# Patient Record
Sex: Female | Born: 1975 | Hispanic: Yes | Marital: Single | Smoking: Never smoker
Health system: Southern US, Community
[De-identification: ages and names within clinical notes are randomized; demographics above are authoritative.]

## PROBLEM LIST (undated history)

## (undated) DIAGNOSIS — C801 Malignant (primary) neoplasm, unspecified: Secondary | ICD-10-CM

## (undated) DIAGNOSIS — Z923 Personal history of irradiation: Secondary | ICD-10-CM

## (undated) DIAGNOSIS — Z8049 Family history of malignant neoplasm of other genital organs: Secondary | ICD-10-CM

## (undated) DIAGNOSIS — C50919 Malignant neoplasm of unspecified site of unspecified female breast: Secondary | ICD-10-CM

## (undated) HISTORY — DX: Family history of malignant neoplasm of other genital organs: Z80.49

## (undated) HISTORY — PX: BREAST LUMPECTOMY: SHX2

---

## 2021-12-28 ENCOUNTER — Other Ambulatory Visit (HOSPITAL_COMMUNITY): Payer: Self-pay | Admitting: Nurse Practitioner

## 2021-12-28 DIAGNOSIS — Z1231 Encounter for screening mammogram for malignant neoplasm of breast: Secondary | ICD-10-CM

## 2021-12-30 ENCOUNTER — Other Ambulatory Visit: Payer: Self-pay

## 2021-12-30 DIAGNOSIS — N6459 Other signs and symptoms in breast: Secondary | ICD-10-CM

## 2021-12-30 DIAGNOSIS — N6310 Unspecified lump in the right breast, unspecified quadrant: Secondary | ICD-10-CM

## 2022-01-04 ENCOUNTER — Ambulatory Visit: Payer: Self-pay | Admitting: *Deleted

## 2022-01-04 ENCOUNTER — Other Ambulatory Visit: Payer: Self-pay

## 2022-01-04 ENCOUNTER — Encounter (INDEPENDENT_AMBULATORY_CARE_PROVIDER_SITE_OTHER): Payer: Self-pay

## 2022-01-04 VITALS — BP 104/68 | Wt 126.2 lb

## 2022-01-04 DIAGNOSIS — N644 Mastodynia: Secondary | ICD-10-CM

## 2022-01-04 DIAGNOSIS — N6311 Unspecified lump in the right breast, upper outer quadrant: Secondary | ICD-10-CM

## 2022-01-04 DIAGNOSIS — Z1211 Encounter for screening for malignant neoplasm of colon: Secondary | ICD-10-CM

## 2022-01-04 DIAGNOSIS — Z1239 Encounter for other screening for malignant neoplasm of breast: Secondary | ICD-10-CM

## 2022-01-04 DIAGNOSIS — N6459 Other signs and symptoms in breast: Secondary | ICD-10-CM

## 2022-01-04 NOTE — Progress Notes (Signed)
[name] is a 46 y.o. female who presents to [facility] clinic today with complaint of right outer breast pain x 4 years that comes and goes. Patient rates the pain at a 6 out of 10. Patient complained of right nipple inversion that has happened over the past year. Patient complained of a right breast mass that was palpated on exam 12/28/2021 at the [facility] Department.    Pap Smear: Pap smear not completed today. Last Pap smear was 09/27/2022 at the [facility] Department clinic and patient stated she has not received the results. Per patient has no history of an abnormal Pap smear. Last Pap smear result is not available in Epic.   Physical exam: Breasts Breasts symmetrical. No skin abnormalities bilateral breasts. No nipple retraction left breast. Observed slight right nipple inversion on exam. No nipple discharge bilateral breasts. No lymphadenopathy. No lumps palpated left breast. Palpated a lump within the right breast at 11 o'clock above the areola. Complaints of right outer breast pain on exam.     Pelvic/Bimanual Pap is not indicated today per BCCCP guidelines.   Smoking History: Patient has never smoked.   Patient Navigation: Patient education provided. Access to services provided for patient through [facility] program. Spanish interpreter Debra Hobbs from [facility]  provided.  Colorectal Cancer Screening: Per patient has never had colonoscopy completed. FIT Test given to patient to complete. No complaints today.    Breast and Cervical Cancer Risk Assessment: Patient does not have family history of breast cancer, known genetic mutations, or radiation treatment to the chest before age 89. Patient does not have history of cervical dysplasia, immunocompromised, or DES exposure in-utero.  Risk Assessment     Risk Scores       01/04/2022   Last edited by: Demetrius Revel, LPN   5-year risk: 0.6 %   Lifetime risk: 7.5 %            A: BCCCP  exam without pap smear Complaint of right breast pain, nipple inversion, and mass.  P: Referred patient to the [facility] for a diagnostic mammogram. Appointment scheduled Thursday, January 13, 2022 at 1400.  Loletta Parish, RN 01/04/2022 10:26 AM

## 2022-01-04 NOTE — Patient Instructions (Signed)
Explained breast self awareness with Paul Half. Patient did not need a Pap smear today due to last Pap smear was 12/28/2021 per patient. Let her know BCCCP will cover Pap smears every 3 years unless has a history of abnormal Pap smears. Referred patient to the [facility] for a diagnostic mammogram. Appointment scheduled Thursday, January 13, 2022 at 1400. Patient aware of appointment and will be there. Paul Half verbalized understanding.  Doroteo Nickolson, Arvil Chaco, RN 10:26 AM

## 2022-01-07 ENCOUNTER — Ambulatory Visit (HOSPITAL_COMMUNITY): Payer: Self-pay

## 2022-01-13 ENCOUNTER — Other Ambulatory Visit: Payer: Self-pay | Admitting: Obstetrics and Gynecology

## 2022-01-13 ENCOUNTER — Ambulatory Visit
Admission: RE | Admit: 2022-01-13 | Discharge: 2022-01-13 | Disposition: A | Discharge status: Home / Self Care | Payer: Self-pay | Source: Ambulatory Visit | Attending: Obstetrics and Gynecology | Admitting: Obstetrics and Gynecology

## 2022-01-13 ENCOUNTER — Ambulatory Visit
Admission: RE | Admit: 2022-01-13 | Discharge: 2022-01-13 | Disposition: A | Discharge status: Home / Self Care | Payer: No Typology Code available for payment source | Source: Ambulatory Visit | Attending: Obstetrics and Gynecology | Admitting: Obstetrics and Gynecology

## 2022-01-13 DIAGNOSIS — N6459 Other signs and symptoms in breast: Secondary | ICD-10-CM

## 2022-01-13 DIAGNOSIS — N6310 Unspecified lump in the right breast, unspecified quadrant: Secondary | ICD-10-CM

## 2022-01-20 ENCOUNTER — Ambulatory Visit
Admission: RE | Admit: 2022-01-20 | Discharge: 2022-01-20 | Disposition: A | Discharge status: Home / Self Care | Payer: No Typology Code available for payment source | Source: Ambulatory Visit | Attending: Obstetrics and Gynecology | Admitting: Obstetrics and Gynecology

## 2022-01-20 DIAGNOSIS — N6459 Other signs and symptoms in breast: Secondary | ICD-10-CM

## 2022-01-20 DIAGNOSIS — N6310 Unspecified lump in the right breast, unspecified quadrant: Secondary | ICD-10-CM

## 2022-01-20 HISTORY — PX: BREAST BIOPSY: SHX20

## 2022-01-26 ENCOUNTER — Encounter: Payer: Self-pay | Admitting: Adult Health

## 2022-01-26 DIAGNOSIS — C50211 Malignant neoplasm of upper-inner quadrant of right female breast: Secondary | ICD-10-CM | POA: Insufficient documentation

## 2022-01-26 DIAGNOSIS — Z170 Estrogen receptor positive status [ER+]: Secondary | ICD-10-CM | POA: Insufficient documentation

## 2022-01-28 ENCOUNTER — Other Ambulatory Visit: Payer: Self-pay | Admitting: Surgery

## 2022-02-01 ENCOUNTER — Other Ambulatory Visit: Payer: Self-pay | Admitting: *Deleted

## 2022-02-01 ENCOUNTER — Telehealth: Payer: Self-pay | Admitting: Radiation Oncology

## 2022-02-01 DIAGNOSIS — Z170 Estrogen receptor positive status [ER+]: Secondary | ICD-10-CM

## 2022-02-01 DIAGNOSIS — C50211 Malignant neoplasm of upper-inner quadrant of right female breast: Secondary | ICD-10-CM

## 2022-02-01 NOTE — Telephone Encounter (Signed)
Called patient to schedule a consultation w. Dr. Squire. No answer, LVM for a return call.  

## 2022-02-02 ENCOUNTER — Other Ambulatory Visit: Payer: Self-pay | Admitting: *Deleted

## 2022-02-03 ENCOUNTER — Telehealth: Payer: Self-pay | Admitting: Hematology and Oncology

## 2022-02-03 NOTE — Telephone Encounter (Signed)
Scheduled appt per 2/15 referral. Used interpreter services. Pt is aware of appt date and time. Pt is aware to arrive 15 mins prior to appt time and to bring and updated insurance card. Pt is aware of appt location.

## 2022-02-06 NOTE — Progress Notes (Signed)
Radiation Oncology         [phone] ________________________________  Initial Outpatient Consultation  Name: Debra Hobbs MRN: [id number]  Date: 02/08/2022  DOB: 05-07-1976  CC:Pcp, No  Coralie Keens, MD   REFERRING PHYSICIAN: Coralie Keens, MD  DIAGNOSIS:    ICD-10-CM   1. 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 female who presented with right breast pain for over a year as well as retraction of her nipple. Subsequent diagnostic bilateral mammogram and right breast ultrasound on 01/13/22 revealed a highly suspicious mass in the retroareolar right breast between the 12 and 1 o'clock positions measuring at least 3.3 cm. No evidence of right axillary lymphadenopathy was appreciated.    Retroareolar right breast biopsy at the 1 o'clock position on date of 01/20/22 showed grade 2 invasive mammary/lobular carcinoma measuring 0.6 cm in the greatest linear extent, and mammary carcinoma in-situ.  ER status: 90% positive with moderate staining intensity; PR status 90% positive with strong staining intensity' Proliferation marker Ki67 at 1%; Her2 status negative; Grade 2. No lymph nodes were examined.  Subsequently, the patient was referred to Dr. Ninfa Linden on 01/28/22 to discuss surgical treatment options. Physical exam performed during this visit revealed deep fullness in the breast centrally at the 12 to 1 o'clock position on the right side. Dr. Ninfa Linden recommends an MRI to determine the overall extent and whether there is nipple involvement. This will determine whether or not the patient would need a central lumpectomy with removal of the  nipple areolar complex versus a mastectomy. The patient will return to Dr. Ninfa Linden following imaging to review results and discuss surgical options further.   MRI breasts pending.  She is with an interpreter. She denies sexual activity currently. She has a family history of GYN cancer in a cousin (uterine vs ovarian? Unknown). Denies smoking or ETOH. No past breastfeeding. She has two children, ages 22 and 31.  Past/Anticipated interventions by medical oncology, if any:  Scheduled for consultation with Dr. Nicholas Lose on 02/14/2022   Lymphedema issues, if any:  None     Pain issues, if any:  Reports occasional pain/discomfort to the right breast. States it resolves on its own    SAFETY ISSUES: Prior radiation? No Pacemaker/ICD? No Possible current pregnancy?No: LMP ~02/01/2022 Is the patient on methotrexate? No   PREVIOUS RADIATION THERAPY: No  PAST MEDICAL HISTORY:  has no past medical history on file.  She has a family history of GYN cancer in a cousin (uterine vs ovarian? Unknown).  PAST SURGICAL HISTORY: Past Surgical History:  Procedure Laterality Date   BREAST BIOPSY Right 01/20/2022    FAMILY HISTORY: family history includes Uterine cancer in her cousin.  SOCIAL HISTORY:  reports that she has never smoked. She has never used smokeless tobacco. She reports that she does not currently use alcohol. She reports that she does not use drugs.  ALLERGIES: Patient has no known allergies.  MEDICATIONS:  No current outpatient medications on file.   No current facility-administered medications for this encounter.    REVIEW OF SYSTEMS: As above in HPI.   PHYSICAL EXAM:  height is 5' (1.524 m)  and weight is 125 lb 2 oz (56.8 kg). Her temperature is 97.2 F (36.2 C) (abnormal). Her blood pressure is 123/74 and her pulse is 65. Her respiration is 18.   General: Alert and oriented, in no acute distress HEENT: Head is normocephalic. Extraocular movements are intact.   Neck: Neck is  supple, no palpable cervical or supraclavicular lymphadenopathy. Heart: Regular in rate and rhythm with no murmurs, rubs, or gallops. Chest: Clear to auscultation bilaterally, with no rhonchi, wheezes, or rales. Extremities: No cyanosis or edema. Skin: No concerning lesions. Musculoskeletal: symmetric strength and muscle tone throughout. Neurologic: Cranial nerves II through XII are grossly intact. No obvious focalities. Speech is fluent. Coordination is intact. Psychiatric: Judgment and insight are intact. Affect is appropriate. Breasts: dense breast tissue, difficult to discern discrete mass in right breast. No obvious palpable masses appreciated in the breasts or axillae b/l .   ECOG = 0  0 - Asymptomatic (Fully active, able to carry on all predisease activities without restriction)  1 - Symptomatic but completely ambulatory (Restricted in physically strenuous activity but ambulatory and able to carry out work of a light or sedentary nature. For example, light housework, office work)  2 - Symptomatic, <50% in bed during the day (Ambulatory and capable of all self care but unable to carry out any work activities. Up and about more than 50% of waking hours)  3 - Symptomatic, >50% in bed, but not bedbound (Capable of only limited self-care, confined to bed or chair 50% or more of waking hours)  4 - Bedbound (Completely disabled. Cannot carry on any self-care. Totally confined to bed or chair)  5 - Death   Eustace Pen MM, Creech RH, Tormey DC, et al. [phone]). "Toxicity and response criteria of the [facility]". [place] Oncol. 5 (6): 649-55   LABORATORY DATA:  No results found for: WBC, HGB, HCT, MCV, PLT CMP  No results found for: NA, K, CL, CO2, GLUCOSE, BUN, CREATININE, CALCIUM, PROT, ALBUMIN, AST, ALT, ALKPHOS, BILITOT, GFRNONAA, GFRAA       RADIOGRAPHY: US BREAST LTD UNI RIGHT INC AXILLA  Result Date: 01/13/2022 CLINICAL DATA:  46 year old female presenting  for evaluation of progressive right nipple inversion over the past year. This is the patient's baseline exam. EXAM: DIGITAL DIAGNOSTIC BILATERAL MAMMOGRAM WITH TOMOSYNTHESIS AND CAD; ULTRASOUND RIGHT BREAST LIMITED TECHNIQUE: Bilateral digital diagnostic mammography and breast tomosynthesis was performed. The images were evaluated with computer-aided detection.; Targeted ultrasound examination of the right breast was performed COMPARISON:  None. ACR Breast Density Category d: The breast tissue is extremely dense, which lowers the sensitivity of mammography. FINDINGS: Pronounced distortion is noted in the anterior retroareolar right breast. No other suspicious calcifications, masses or areas of distortion are seen in the bilateral breasts. Physical exam of the right breast demonstrates asymmetric partial retraction of the right nipple. Ultrasound targeted to the retroareolar right breast demonstrates an ill-defined hypoechoic area with sonographic distortion between 12 and 1 o'clock. The most masslike portion of the abnormality is at 12 o'clock, 2 cm from the nipple, but all together, the abnormality spans approximately 3.3 x 1.8 x 2.9 cm as measured at 1 o'clock, 1 cm from the nipple. Measurement is difficult as the margins are very indistinct. Ultrasound of the right axilla demonstrates multiple normal-appearing lymph nodes. IMPRESSION: 1. There is a highly suspicious mass in the retroareolar right breast between 12 and 1 o'clock measuring at least 3.3 cm, though measurement is difficult due to the indistinct appearance.  2.  No evidence of right axillary lymphadenopathy. RECOMMENDATION: 1. Ultrasound-guided biopsy is recommended for the right breast mass, and has been scheduled for 01/20/2022 at 7:30 a.m. 2. If malignancy is found on pathology, and the patient desires breast conservation, MRI is recommended to determine true extent of disease given the ill-defined appearance of the mass, the patient's breast tissue  density and her premenopausal status. I have discussed the findings and recommendations with the patient. If applicable, a reminder letter will be sent to the patient regarding the next appointment. BI-RADS CATEGORY  5: Highly suggestive of malignancy. Electronically Signed   By: Ammie Ferrier M.D.   On: 01/13/2022 15:15  MS DIGITAL DIAG TOMO BILAT  Result Date: 01/13/2022 CLINICAL DATA:  46 year old female presenting for evaluation of progressive right nipple inversion over the past year. This is the patient's baseline exam. EXAM: DIGITAL DIAGNOSTIC BILATERAL MAMMOGRAM WITH TOMOSYNTHESIS AND CAD; ULTRASOUND RIGHT BREAST LIMITED TECHNIQUE: Bilateral digital diagnostic mammography and breast tomosynthesis was performed. The images were evaluated with computer-aided detection.; Targeted ultrasound examination of the right breast was performed COMPARISON:  None. ACR Breast Density Category d: The breast tissue is extremely dense, which lowers the sensitivity of mammography. FINDINGS: Pronounced distortion is noted in the anterior retroareolar right breast. No other suspicious calcifications, masses or areas of distortion are seen in the bilateral breasts. Physical exam of the right breast demonstrates asymmetric partial retraction of the right nipple. Ultrasound targeted to the retroareolar right breast demonstrates an ill-defined hypoechoic area with sonographic distortion between 12 and 1 o'clock. The most masslike portion of the abnormality is at 12 o'clock, 2 cm from the nipple, but all together, the abnormality spans approximately 3.3 x 1.8 x 2.9 cm as measured at 1 o'clock, 1 cm from the nipple. Measurement is difficult as the margins are very indistinct. Ultrasound of the right axilla demonstrates multiple normal-appearing lymph nodes. IMPRESSION: 1. There is a highly suspicious mass in the retroareolar right breast between 12 and 1 o'clock measuring at least 3.3 cm, though measurement is difficult due  to the indistinct appearance. 2.  No evidence of right axillary lymphadenopathy. RECOMMENDATION: 1. Ultrasound-guided biopsy is recommended for the right breast mass, and has been scheduled for 01/20/2022 at 7:30 a.m. 2. If malignancy is found on pathology, and the patient desires breast conservation, MRI is recommended to determine true extent of disease given the ill-defined appearance of the mass, the patient's breast tissue density and her premenopausal status. I have discussed the findings and recommendations with the patient. If applicable, a reminder letter will be sent to the patient regarding the next appointment. BI-RADS CATEGORY  5: Highly suggestive of malignancy. Electronically Signed   By: Ammie Ferrier M.D.   On: 01/13/2022 15:15  MM CLIP PLACEMENT RIGHT  Result Date: 01/20/2022 CLINICAL DATA:  Status post ultrasound-guided core biopsy of RIGHT breast mass. EXAM: 3D DIAGNOSTIC RIGHT MAMMOGRAM POST ULTRASOUND BIOPSY COMPARISON:  Previous exam(s). FINDINGS: 3D Mammographic images were obtained following ultrasound guided biopsy of mass in the 1 o'clock retroareolar region of the RIGHT breast and placement of a ribbon shaped. The biopsy marking clip is in expected position at the site of biopsy. IMPRESSION: Appropriate positioning of the ribbon shaped biopsy marking clip at the site of biopsy in the retroareolar RIGHT breast. Final Assessment: Post Procedure Mammograms for Marker Placement Electronically Signed   By: Nolon Nations M.D.   On: 01/20/2022 08:30  [place] RT BREAST BX W LOC DEV 1ST LESION IMG BX SPEC US  GUIDE  Addendum Date: 01/28/2022   ADDENDUM REPORT: 01/28/2022 08:43 ADDENDUM: Pathology revealed GRADE II INVASIVE MAMMARY CARCINOMA, MAMMARY CARCINOMA IN-SITU of the RIGHT breast, 1 o'clock, retroareolar region, (ribbon clip). This was found to be concordant by Dr. Nolon Nations. Pathology results were discussed with the patient by telephone by Kathrine Haddock, Bilingual Patient  Services Representative. The patient reported doing well after the biopsy with tenderness at the site. Post biopsy instructions and care were reviewed and questions were answered. The patient was encouraged to call [facility] for any additional concerns. Surgical consultation has been arranged with Dr. Nedra Hai at [facility] on January 28, 2022. MRI is recommended if breast conservation is being considered to determine true extent of disease given the ill-defined appearance of the mass, the patient's breast tissue density and her premenopausal status. Pathology results reported by Terie Purser, RN on 01/21/2022. Electronically Signed   By: Nolon Nations M.D.   On: 01/28/2022 08:43   Result Date: 01/28/2022 CLINICAL DATA:  Patient presents for ultrasound-guided core biopsy of RIGHT breast mass. EXAM: ULTRASOUND GUIDED RIGHT BREAST CORE NEEDLE BIOPSY COMPARISON:  Previous exam(s). PROCEDURE: I met with the patient and we discussed the procedure of ultrasound-guided biopsy, including benefits and alternatives. We discussed the high likelihood of a successful procedure. We discussed the risks of the procedure, including infection, bleeding, tissue injury, clip migration, and inadequate sampling. Informed written consent was given. The usual time-out protocol was performed immediately prior to the procedure. Lesion quadrant: Central breast, 1 o'clock retroareolar Using sterile technique and 1% Lidocaine as local anesthetic, under direct ultrasound visualization, a 12 gauge spring-loaded device was used to perform biopsy of mass in the 1 o'clock retroareolar region of the RIGHT breast using a inferomedial approach. At the conclusion of the procedure ribbon shaped tissue marker clip was deployed into the biopsy cavity. Follow up 2 view mammogram was performed and dictated separately. IMPRESSION: Ultrasound guided biopsy of RIGHT breast mass. No apparent complications.  Electronically Signed: By: Nolon Nations M.D. On: 01/20/2022 08:13     IMPRESSION/PLAN: Right breast cancer ER+  MRI pending - nursing will look into why this has not been scheduled, yet.  Surgery (lumpectomy vs mastectomy) will depend on these results. We talked about lumpectomy --> radiation therapy vs mastectomy (with smaller chance of needing radiation therapy) today.   It was a pleasure meeting the patient today. We discussed the risks, benefits, and side effects of radiotherapy. If she conserves her right  breast, I recommend radiotherapy to the breast to reduce her risk of locoregional recurrence by 2/3.  We discussed that radiation would take approximately 4-6 weeks to complete and that I would give the patient a few weeks to heal following surgery before starting treatment planning.  If chemotherapy were to be given, this would precede radiotherapy. We spoke about acute effects including skin irritation and fatigue as well as much less common late effects including internal organ injury or irritation. We spoke about the latest technology that is used to minimize the risk of late effects for patients undergoing radiotherapy to the breast or chest wall. No guarantees of treatment were given. The patient is enthusiastic about proceeding with treatment. I look forward to participating in the patient's care. Consent signed today. I will await her referral back to me for postoperative follow-up and eventual CT simulation/treatment planning. She understands there is a smaller chance of needing radiation therapy post-mastectomy.  I personally messaged genetics to inquire  as to whether a referral is appropriate in her case.  We also discussed importance of avoiding pregnancy if sexually active- use of condoms recommended.  On date of service, in total, I spent 60 minutes on this encounter. Patient was seen in person.   __________________________________________   Eppie Gibson, MD   This  document serves as a record of services personally performed by Eppie Gibson, MD. It was created on her behalf by Roney Mans, a trained medical scribe. The creation of this record is based on the scribe's personal observations and the provider's statements to them. This document has been checked and approved by the attending provider.

## 2022-02-07 NOTE — Progress Notes (Signed)
Location of Breast Cancer:  Malignant neoplasm of central portion of right breast in female, estrogen receptor positive   Histology per Pathology Report:  (Definitive pathology pending eventual surgery) 01/20/2022 Diagnosis Breast, right, needle core biopsy, 1 o'clock, retroareolar region, ribbon clip - INVASIVE MAMMARY CARCINOMA - MAMMARY CARCINOMA IN-SITU - SEE COMMENT Microscopic Comment the biopsy material shows an infiltrative proliferation of arranged linearly and in small clusters. Based on the biopsy, the carcinoma appears Nottingham grade 2 of 3 and measures 0.6 cm in greatest linear extent.  Receptor Status: ER(90%), PR (90%), Her2-neu (Negative via [place]), Ki-67(1%)  Did patient present with symptoms (if so, please note symptoms) or was this found on screening mammography?:    Past/Anticipated interventions by surgeon, if any:  01/28/2022 Dr. Coralie Keens (office visit)   Past/Anticipated interventions by medical oncology, if any:  Scheduled for consultation with Dr. Nicholas Lose on 02/14/2022  Lymphedema issues, if any:  None    Pain issues, if any:  Reports occasional pain/discomfort to the right breast. States it resolves on its own   SAFETY ISSUES: Prior radiation? No Pacemaker/ICD? No Possible current pregnancy?No: LMP ~02/01/2022 Is the patient on methotrexate? No  Current Complaints / other details:  Nothing else of note

## 2022-02-08 ENCOUNTER — Encounter: Payer: Self-pay | Admitting: Radiation Oncology

## 2022-02-08 ENCOUNTER — Other Ambulatory Visit: Payer: Self-pay

## 2022-02-08 ENCOUNTER — Ambulatory Visit
Admission: RE | Admit: 2022-02-08 | Discharge: 2022-02-08 | Disposition: A | Discharge status: Home / Self Care | Payer: No Typology Code available for payment source | Source: Ambulatory Visit | Attending: Radiation Oncology | Admitting: Radiation Oncology

## 2022-02-08 VITALS — BP 123/74 | HR 65 | Temp 97.2°F | Resp 18 | Ht 60.0 in | Wt 125.1 lb

## 2022-02-08 DIAGNOSIS — C50211 Malignant neoplasm of upper-inner quadrant of right female breast: Secondary | ICD-10-CM

## 2022-02-08 DIAGNOSIS — Z808 Family history of malignant neoplasm of other organs or systems: Secondary | ICD-10-CM | POA: Insufficient documentation

## 2022-02-08 DIAGNOSIS — Z170 Estrogen receptor positive status [ER+]: Secondary | ICD-10-CM

## 2022-02-09 ENCOUNTER — Other Ambulatory Visit: Payer: Self-pay | Admitting: Surgery

## 2022-02-09 DIAGNOSIS — C50511 Malignant neoplasm of lower-outer quadrant of right female breast: Secondary | ICD-10-CM

## 2022-02-09 DIAGNOSIS — Z170 Estrogen receptor positive status [ER+]: Secondary | ICD-10-CM

## 2022-02-10 ENCOUNTER — Telehealth: Payer: Self-pay | Admitting: Genetic Counselor

## 2022-02-10 NOTE — Telephone Encounter (Signed)
Scheduled appt per 2/21 referral. Used interpreter services. Pt is aware of appt date and time. Pt is aware to arrive 15 mins prior to appt time and to bring and updated insurance card. Pt is aware of appt location.

## 2022-02-11 NOTE — Progress Notes (Signed)
[facility] CONSULT NOTE  Patient Care Team: Pcp, No as PCP - General  CHIEF COMPLAINTS/PURPOSE OF CONSULTATION:  Newly diagnosed right breast cancer  HISTORY OF PRESENTING ILLNESS:  Debra Hobbs 46 y.o. female is here because of recent diagnosis of invasive mammary carcinoma of the right breast. She presented with progressive right nipple inversion over the past year. Diagnostic mammogram and [place] on 01/13/2022 showed a highly suspicious mass in the retroareolar right breast between 12 and 1 o'clock measuring at least 3.3 cm. Biopsy on 01/20/2022 showed invasive mammary carcinoma and mammary carcinoma in situ, Her2-, ER/PR+(90%). She presents to the clinic today for initial evaluation and discussion of treatment options.   I reviewed her records extensively and collaborated the history with the patient.  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  SURGICAL HISTORY: Past Surgical History:  Procedure Laterality Date   BREAST BIOPSY Right 01/20/2022    SOCIAL HISTORY: Social History   Socioeconomic History   Marital status: Single    Spouse name: Not on file   Number of children: 2   Years of education: Not on file   Highest education level: 9th grade  Occupational History   Not on file  Tobacco Use   Smoking status: Never   Smokeless tobacco: Never   Vaping Use   Vaping Use: Never used  Substance and Sexual Activity   Alcohol use: Not Currently   Drug use: Never   Sexual activity: Not Currently  Other Topics Concern   Not on file  Social History Narrative   Not on file   Social Determinants of Health   Financial Resource Strain: Not on file  Food Insecurity: No Food Insecurity   Worried About Running Out of Food in the Last Year: Never true   [place] in the Last Year: Never true  Transportation Needs: No Transportation Needs   Lack of Transportation (Medical): No   Lack of Transportation (Non-Medical): No  Physical Activity: Not on file  Stress: Not on file  Social Connections: Not on file  Intimate Partner Violence: Not on file    FAMILY HISTORY: Family History  Problem Relation Age of Onset   Uterine cancer Cousin    Breast cancer Neg Hx     ALLERGIES:  has No Known Allergies.  MEDICATIONS:  No current outpatient medications on file.   No current facility-administered medications for this visit.    REVIEW OF SYSTEMS:   All other systems were reviewed with the patient and are negative.  PHYSICAL EXAMINATION: ECOG PERFORMANCE STATUS: 1 - Symptomatic but completely ambulatory  Vitals:   02/14/22 1256  BP: 109/62  Pulse: 74  Resp: 18  Temp: 98.1 F (36.7 C)  SpO2: 100%   Filed Weights   02/14/22 1256  Weight: 126 lb 6.4 oz (57.3 kg)     RADIOGRAPHIC STUDIES: I have personally reviewed the  radiological reports and agreed with the findings in the report.  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 After reviewing the pathology in detail, we proceeded to discuss the different treatment options between surgery, radiation, chemotherapy, antiestrogen therapies.  Recommendations: 1. Breast conserving surgery followed by 2. Oncotype DX testing to determine if chemotherapy would be of any benefit followed by 3. Adjuvant radiation therapy followed by 4. Adjuvant antiestrogen therapy  Oncotype counseling: I discussed Oncotype DX test. I explained to the patient that this is a 21 gene panel to evaluate patient tumors DNA to calculate recurrence score. This would help determine whether patient has high risk or low risk breast cancer. She understands that if her tumor was found to be high risk, she would benefit from systemic chemotherapy. If low risk, no need of chemotherapy.  Return to clinic after surgery to discuss final pathology report and then determine if Oncotype DX testing will need to be sent. 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  She is worried about losing income and the cost of care as well.     All questions were answered. The patient knows to call the clinic with any problems, questions or concerns.   Rulon Eisenmenger, MD, MPH 02/14/2022    I, Thana Ates, am acting as scribe for Nicholas Lose, MD.  I have reviewed the above documentation for accuracy and completeness, and I agree with the above.

## 2022-02-12 ENCOUNTER — Ambulatory Visit
Admission: RE | Admit: 2022-02-12 | Discharge: 2022-02-12 | Disposition: A | Discharge status: Home / Self Care | Payer: No Typology Code available for payment source | Source: Ambulatory Visit | Attending: Surgery | Admitting: Surgery

## 2022-02-12 DIAGNOSIS — Z170 Estrogen receptor positive status [ER+]: Secondary | ICD-10-CM

## 2022-02-12 DIAGNOSIS — C50511 Malignant neoplasm of lower-outer quadrant of right female breast: Secondary | ICD-10-CM

## 2022-02-12 MED ORDER — GADOBUTROL 1 MMOL/ML IV SOLN
6.0000 mL | Freq: Once | INTRAVENOUS | Status: AC | PRN
  Administered 2022-02-12: 6 mL via INTRAVENOUS

## 2022-02-14 ENCOUNTER — Inpatient Hospital Stay
Payer: No Typology Code available for payment source | Attending: Hematology and Oncology | Admitting: Hematology and Oncology

## 2022-02-14 ENCOUNTER — Encounter: Payer: Self-pay | Admitting: *Deleted

## 2022-02-14 ENCOUNTER — Telehealth: Payer: Self-pay

## 2022-02-14 ENCOUNTER — Other Ambulatory Visit: Payer: Self-pay

## 2022-02-14 DIAGNOSIS — Z170 Estrogen receptor positive status [ER+]: Secondary | ICD-10-CM | POA: Insufficient documentation

## 2022-02-14 DIAGNOSIS — C50211 Malignant neoplasm of upper-inner quadrant of right female breast: Secondary | ICD-10-CM | POA: Insufficient documentation

## 2022-02-14 NOTE — Telephone Encounter (Signed)
Via Debra Hobbs, Spanish Intepreter [facility]), Verification of [place] citizenship was asked to determine if patient was eligible to apply for [facility] Medicaid (Dx: Breast cancer). Patient verified that she was not a [place] citizen nor a resident, was given [place] application.

## 2022-02-14 NOTE — Assessment & Plan Note (Signed)
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 After reviewing the pathology in detail, we proceeded to discuss the different treatment options between surgery, radiation, chemotherapy, antiestrogen therapies.  Recommendations: 1. Breast conserving surgery followed by 2. Oncotype DX testing to determine if chemotherapy would be of any benefit followed by 3. Adjuvant radiation therapy followed by 4. Adjuvant antiestrogen therapy  Oncotype counseling: I discussed Oncotype DX test. I explained to the patient that this is a 21 gene panel to evaluate patient tumors DNA to calculate recurrence score. This would help determine whether patient has high risk or low risk breast cancer. She understands that if her tumor was found to be high risk, she would benefit from systemic chemotherapy. If low risk, no need of chemotherapy.  Return to clinic after surgery to discuss final pathology report and then determine if Oncotype DX testing will need to be sent.

## 2022-02-22 ENCOUNTER — Ambulatory Visit: Payer: No Typology Code available for payment source | Admitting: Physical Therapy

## 2022-02-22 ENCOUNTER — Other Ambulatory Visit: Payer: Self-pay | Admitting: Genetic Counselor

## 2022-02-22 DIAGNOSIS — C50211 Malignant neoplasm of upper-inner quadrant of right female breast: Secondary | ICD-10-CM

## 2022-02-22 DIAGNOSIS — Z170 Estrogen receptor positive status [ER+]: Secondary | ICD-10-CM

## 2022-02-23 ENCOUNTER — Encounter: Payer: Self-pay | Admitting: Physical Therapy

## 2022-02-23 ENCOUNTER — Encounter: Payer: Self-pay | Admitting: Genetic Counselor

## 2022-02-23 ENCOUNTER — Inpatient Hospital Stay: Payer: Self-pay | Attending: Hematology and Oncology | Admitting: Genetic Counselor

## 2022-02-23 ENCOUNTER — Inpatient Hospital Stay: Payer: Self-pay

## 2022-02-23 ENCOUNTER — Ambulatory Visit: Payer: No Typology Code available for payment source | Attending: Surgery | Admitting: Physical Therapy

## 2022-02-23 ENCOUNTER — Other Ambulatory Visit: Payer: Self-pay

## 2022-02-23 DIAGNOSIS — Z8049 Family history of malignant neoplasm of other genital organs: Secondary | ICD-10-CM | POA: Insufficient documentation

## 2022-02-23 DIAGNOSIS — Z170 Estrogen receptor positive status [ER+]: Secondary | ICD-10-CM

## 2022-02-23 DIAGNOSIS — C50211 Malignant neoplasm of upper-inner quadrant of right female breast: Secondary | ICD-10-CM

## 2022-02-23 DIAGNOSIS — R293 Abnormal posture: Secondary | ICD-10-CM | POA: Insufficient documentation

## 2022-02-23 LAB — GENETIC SCREENING ORDER

## 2022-02-23 NOTE — Therapy (Signed)
OUTPATIENT PHYSICAL THERAPY BREAST CANCER BASELINE EVALUATION   Patient Name: Debra Hobbs MRN: [id number] DOB:[birth year], 46 y.o., female Today's Date: 02/23/2022   PT End of Session - 02/23/22 0811     Visit Number 1    Number of Visits 2    Date for PT Re-Evaluation 04/20/22    PT Start Time 0808   interpreter arrived late   PT Stop Time 0858    PT Time Calculation (min) 50 min    Activity Tolerance Patient tolerated treatment well    Behavior During Therapy [facility] for tasks assessed/performed             History reviewed. No pertinent past medical history. Past Surgical History:  Procedure Laterality Date   BREAST BIOPSY Right 01/20/2022   Patient Active Problem List   Diagnosis Date Noted   Malignant neoplasm of upper-inner quadrant of right breast in female, estrogen receptor positive ([place]) 01/26/2022    PCP: Pcp, No  REFERRING PROVIDER: Coralie Keens, MD  REFERRING DIAG: C50.211,Z17.0 (ICD-10-CM) - Malignant neoplasm of upper-inner quadrant of right breast in female, estrogen receptor positive ([place])   THERAPY DIAG:  Abnormal posture  Malignant neoplasm of upper-inner quadrant of right breast in female, estrogen receptor positive ([place])  ONSET DATE: 01/20/22  SUBJECTIVE                                                                                                                                                                                           SUBJECTIVE STATEMENT: Patient reports she is here today to be seen by her medical team for her newly diagnosed right breast cancer.   PERTINENT HISTORY:  Patient was diagnosed on 01/20/22 with right grade 3. It measures 3.3 cm. It is ER/PR positive 90%, HER2- with a Ki67 of 1%.   PATIENT GOALS   reduce lymphedema risk and learn post op HEP.   PAIN:  Are you having pain? Yes NPRS scale: 7/10 Pain location: neck Pain orientation: Medial  PAIN TYPE: burning Pain description: intermittent   Aggravating factors: nothing Relieving factors: tylenol or advil  PRECAUTIONS: Active CA   HAND DOMINANCE: right  WEIGHT BEARING RESTRICTIONS No  FALLS:  Has patient fallen in last 6 months? No, Number of falls: 0  LIVING ENVIRONMENT: Patient lives with: son 32 and daughter 36 Lives in: Mobile home Has following equipment at home:  None  OCCUPATION: Glass blower/designer - full time  LEISURE: pt does not exercise  PRIOR LEVEL OF FUNCTION: Independent   OBJECTIVE  COGNITION:  Overall cognitive status: Within functional limits for tasks assessed    POSTURE:  Forward head and rounded  shoulders posture  UPPER EXTREMITY AROM/PROM:  A/PROM RIGHT  02/23/2022   Shoulder extension 74  Shoulder flexion 165  Shoulder abduction 178  Shoulder internal rotation 67  Shoulder external rotation 90    (Blank rows = not tested)  A/PROM LEFT  02/23/2022  Shoulder extension 64  Shoulder flexion 168  Shoulder abduction 175  Shoulder internal rotation 77  Shoulder external rotation 74    (Blank rows = not tested)    LYMPHEDEMA ASSESSMENTS:   LANDMARK RIGHT  02/23/2022  10 cm proximal to olecranon process 25.4  Olecranon process 22.4  10 cm proximal to ulnar styloid process 20  Just proximal to ulnar styloid process 15.7  Across hand at thumb web space 18  At base of 2nd digit 6  (Blank rows = not tested)  LANDMARK LEFT  02/23/2022  10 cm proximal to olecranon process 25.5  Olecranon process 22  10 cm proximal to ulnar styloid process 20.2  Just proximal to ulnar styloid process 15.2  Across hand at thumb web space 17.2  At base of 2nd digit 5.7  (Blank rows = not tested)   L-DEX LYMPHEDEMA SCREENING:  The patient was assessed using the L-Dex machine today to produce a lymphedema index baseline score. The patient will be reassessed on a regular basis (typically every 3 months) to obtain new L-Dex scores. If the score is > 6.5 points away from his/her baseline score indicating  onset of subclinical lymphedema, it will be recommended to wear a compression garment for 4 weeks, 12 hours per day and then be reassessed. If the score continues to be > 6.5 points from baseline at reassessment, we will initiate lymphedema treatment. Assessing in this manner has a 95% rate of preventing clinically significant lymphedema.   L-DEX FLOWSHEETS - 02/23/22 0900       L-DEX LYMPHEDEMA SCREENING   Measurement Type Unilateral    L-DEX MEASUREMENT EXTREMITY Upper Extremity    POSITION  Standing    DOMINANT SIDE Right    At Risk Side Right    BASELINE SCORE (UNILATERAL) -6.8              PATIENT EDUCATION:  Education details: Lymphedema risk reduction and post op shoulder/posture HEP Person educated: Patient Education method: Explanation, Demonstration, Handout Education comprehension: Patient verbalized understanding and returned demonstration   HOME EXERCISE PROGRAM: Patient was instructed today in a home exercise program today for post op shoulder range of motion. These included active assist shoulder flexion in sitting, scapular retraction, wall walking with shoulder abduction, and hands behind head external rotation.  She was encouraged to do these twice a day, holding 3 seconds and repeating 5 times when permitted by her physician.   ASSESSMENT:  CLINICAL IMPRESSION: Pt presents to PT for recently diagnosed right breast cancer. She is planning to have R lumpectomy and her surgery date has not been scheduled yet. Her shoulder ROM is WFL. She will benefit from a post op PT reassessment to determine needs and from L-Dex screens every 3 months for 2 years to detect subclinical lymphedema.  Pt will benefit from skilled therapeutic intervention to improve on the following deficits: Decreased knowledge of precautions, impaired UE functional use, pain, decreased ROM, postural dysfunction.   PT treatment/interventions: ADL/self-care home management, pt/family education,  therapeutic exercise  REHAB POTENTIAL: Good  CLINICAL DECISION MAKING: Stable/uncomplicated  EVALUATION COMPLEXITY: Low   GOALS: Goals reviewed with patient? YES  LONG TERM GOALS: (STG=LTG)   Name Target Date Goal status  1 Pt will be able to verbalize understanding of pertinent lymphedema risk reduction practices relevant to her dx specifically related to skin care.  Baseline:  No knowledge 02/23/2022 Achieved at eval  2 Pt will be able to return demo and/or verbalize understanding of the post op HEP related to regaining shoulder ROM. Baseline:  No knowledge 02/23/2022 Achieved at eval  3 Pt will be able to verbalize understanding of the importance of attending the post op After Breast CA Class for further lymphedema risk reduction education and therapeutic exercise.  Baseline:  No knowledge 02/23/2022 Achieved at eval  4 Pt will demo she has regained full shoulder ROM and function post operatively compared to baselines.  Baseline: See objective measurements taken today. 04/20/22 New     PLAN: PT FREQUENCY/DURATION: EVAL and 1 follow up appointment.   PLAN FOR NEXT SESSION: will reassess 3-4 weeks post op to determine needs.   Patient will follow up at outpatient cancer rehab 3-4 weeks following surgery.  If the patient requires physical therapy at that time, a specific plan will be dictated and sent to the referring physician for approval. The patient was educated today on appropriate basic range of motion exercises to begin post operatively and the importance of attending the After Breast Cancer class following surgery.  Patient was educated today on lymphedema risk reduction practices as it pertains to recommendations that will benefit the patient immediately following surgery.  She verbalized good understanding.    Physical Therapy Information for After Breast Cancer Surgery/Treatment:  Lymphedema is a swelling condition that you may be at risk for in your arm if you have lymph nodes  removed from the armpit area.  After a sentinel node biopsy, the risk is approximately 5-9% and is higher after an axillary node dissection.  There is treatment available for this condition and it is not life-threatening.  Contact your physician or physical therapist with concerns. You may begin the 4 shoulder/posture exercises (see additional sheet) when permitted by your physician (typically a week after surgery).  If you have drains, you may need to wait until those are removed before beginning range of motion exercises.  A general recommendation is to not lift your arms above shoulder height until drains are removed.  These exercises should be done to your tolerance and gently.  This is not a "no pain/no gain" type of recovery so listen to your body and stretch into the range of motion that you can tolerate, stopping if you have pain.  If you are having immediate reconstruction, ask your plastic surgeon about doing exercises as he or she may want you to wait. We encourage you to attend the free one time ABC (After Breast Cancer) class offered by [facility].  You will learn information related to lymphedema risk, prevention and treatment and additional exercises to regain mobility following surgery.  You can call [phone] for more information.  This is offered the 1st and 3rd Monday of each month.  You only attend the class one time. While undergoing any medical procedure or treatment, try to avoid blood pressure being taken or needle sticks from occurring on the arm on the side of cancer.   This recommendation begins after surgery and continues for the rest of your life.  This may help reduce your risk of getting lymphedema (swelling in your arm). An excellent resource for those seeking information on lymphedema is the National Lymphedema Network's web site. It can be accessed at [place] If  you notice swelling in your hand, arm or breast at any time following surgery  (even if it is many years from now), please contact your doctor or physical therapist to discuss this.  Lymphedema can be treated at any time but it is easier for you if it is treated early on.  If you feel like your shoulder motion is not returning to normal in a reasonable amount of time, please contact your surgeon or physical therapist.  [facility] [phone]. [address], [address], [place] [address]  ABC CLASS After Breast Cancer Class  After Breast Cancer Class is a specially designed exercise class to assist you in a safe recover after having breast cancer surgery.  In this class you will learn how to get back to full function whether your drains were just removed or if you had surgery a month ago.  This one-time class is held the 1st and 3rd Monday of every month from 11:00 a.m. until 12:00 noon virtually.  This class is FREE and space is limited. For more information or to register for the next available class, call [phone].  Class Goals  Understand specific stretches to improve the flexibility of you chest and shoulder. Learn ways to safely strengthen your upper body and improve your posture. Understand the warning signs of infection and why you may be at risk for an arm infection. Learn about Lymphedema and prevention.  ** You do not attend this class until after surgery.  Drains must be removed to participate  Patient was instructed today in a home exercise program today for post op shoulder range of motion. These included active assist shoulder flexion in sitting, scapular retraction, wall walking with shoulder abduction, and hands behind head external rotation.  She was encouraged to do these twice a day, holding 3 seconds and repeating 5 times when permitted by her physician.    [facility], PT 02/23/2022, 9:03 AM

## 2022-02-23 NOTE — Progress Notes (Signed)
REFERRING PROVIDER: Eppie Gibson, MD [address],  [place] [address]  PRIMARY PROVIDER:  Pcp, No  PRIMARY REASON FOR VISIT:  1. Family history of uterine cancer   2. Malignant neoplasm of upper-inner quadrant of right breast in female, estrogen receptor positive ([place])      HISTORY OF PRESENT ILLNESS:   [name], a 46 y.o. female, was seen for a [facility] cancer genetics consultation at the request of Dr. Isidore Moos due to a personal and family history of cancer.  [name] presents to clinic today, with interpreter Lavon Paganini, to discuss the possibility of a hereditary predisposition to cancer, genetic testing, and to further clarify her future cancer risks, as well as potential cancer risks for family members.   In February 2023, at the age of 4, [name] was diagnosed with cancer of the right breast. The treatment plan, per the patient, is unknown.      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  First live birth at age 68.  OCP use for approximately 0 years.  Ovaries intact: yes.  Hysterectomy: no.  Menopausal status: premenopausal.  HRT use: 0 years. Colonoscopy: no; not examined. Mammogram within the last year: yes. Number of breast biopsies: 1. Up to date with pelvic exams: yes. Any excessive radiation exposure in the past: no  Past Medical History:   Diagnosis Date   Family history of uterine cancer     Past Surgical History:  Procedure Laterality Date   BREAST BIOPSY Right 01/20/2022    Social History   Socioeconomic History   Marital status: Single    Spouse name: Not on file   Number of children: 2   Years of education: Not on file   Highest education level: 9th grade  Occupational History   Not on file  Tobacco Use   Smoking status: Never   Smokeless tobacco: Never  Vaping Use   Vaping Use: Never used  Substance and Sexual Activity   Alcohol use: Not Currently   Drug use: Never   Sexual activity: Not Currently  Other Topics Concern   Not on file  Social History Narrative   Not on file   Social Determinants of Health   Financial Resource Strain: Not on file  Food Insecurity: No Food Insecurity   Worried About Running Out of Food in the Last Year: Never true   [place] in the Last Year: Never true  Transportation Needs: No Transportation Needs   Lack of Transportation (Medical): No   Lack of Transportation (Non-Medical): No  Physical Activity: Not on file  Stress: Not on file  Social Connections: Not on file     FAMILY HISTORY:  We obtained a detailed, 4-generation family history.  Significant diagnoses are listed below: Family History  Problem Relation Age of Onset   Uterine cancer Cousin  paternal first cousin   Breast cancer Neg Hx     The patient has a son and daughter who are cancer free.  She has three brothers and four sisters who are cancer free.  Both parents are living.  Her mother is living at 47.  She is an only child.  Her parents are deceased.  The patient's father is living.  He has three brothers and a sister who are cancer free.  One cousin was diagnosed with uterine cancer.  Debra Hobbs is unaware of previous family history of genetic testing for hereditary cancer risks. Patient's maternal ancestors are of [place] descent, and paternal ancestors are of [place]  descent. There is no reported Ashkenazi Jewish ancestry. There is no known consanguinity.  GENETIC COUNSELING ASSESSMENT: Ms. Pretty Weltman is a 46 y.o. female with a personal and family history of cancer which is somewhat suggestive of a hereditary cancer syndrome and predisposition to cancer given her young age of onset. We, therefore, discussed and recommended the following at today's visit.   DISCUSSION: We discussed that, in general, most cancer is not inherited in families, but instead is sporadic or familial. Sporadic cancers occur by chance and typically happen at older ages (>50 years) as this type of cancer is caused by genetic changes acquired during an individuals lifetime. Some families have more cancers than would be expected by chance; however, the ages or types of cancer are not consistent with a known genetic mutation or known genetic mutations have been ruled out. This type of familial cancer is thought to be due to a combination of multiple genetic, environmental, hormonal, and lifestyle factors. While this combination of factors likely increases the risk of cancer, the exact source of this risk is not currently identifiable or testable.  We discussed that 5 - 10% of breast cancer is hereditary.  We discussed genes in general, as the patient had not heard of these before. We discussed that some genes play a strong role in breast cancer and can change her surgical decisions.  Others do not play this same role, and while still important from a cancer standpoint, will not be as important to get back quickly.  We discussed that testing is beneficial for several reasons including knowing how to follow individuals after completing their treatment, identifying whether potential treatment options such as PARP inhibitors would be beneficial, and understand if other family members could be at risk for cancer and allow them to undergo genetic testing.   We reviewed the characteristics, features and  inheritance patterns of hereditary cancer syndromes. We also discussed genetic testing, including the appropriate family members to test, the process of testing, insurance coverage and turn-around-time for results. We discussed the implications of a negative, positive and/or variant of uncertain significant result. In order to get genetic test results in a timely manner so that Ms. Myana Schlup can use these genetic test results for surgical decisions, we recommended Ms. Kerrin Mo pursue genetic testing for the BRCAPlus. Once complete, we recommend Ms. Kerrin Mo pursue reflex genetic testing to the CancerNext-Expanded+RNAinsight gene panel.   The CancerNext-Expanded gene panel offered by [facility] and includes sequencing and rearrangement analysis for the following 77 genes: AIP, ALK, APC*, ATM*, AXIN2, BAP1, BARD1, BLM, BMPR1A, BRCA1*, BRCA2*, BRIP1*, CDC73, CDH1*, CDK4, CDKN1B, CDKN2A, CHEK2*, CTNNA1, DICER1, FANCC, FH, FLCN, GALNT12, KIF1B, LZTR1, MAX, MEN1, MET, MLH1*, MSH2*, MSH3, MSH6*, MUTYH*, NBN, NF1*, NF2, NTHL1, PALB2*, PHOX2B, PMS2*, POT1, PRKAR1A, PTCH1, PTEN*, RAD51C*, RAD51D*, RB1, RECQL, RET,  SDHA, SDHAF2, SDHB, SDHC, SDHD, SMAD4, SMARCA4, SMARCB1, SMARCE1, STK11, SUFU, TMEM127, TP53*, TSC1, TSC2, VHL and XRCC2 (sequencing and deletion/duplication); EGFR, EGLN1, HOXB13, KIT, MITF, PDGFRA, POLD1, and POLE (sequencing only); EPCAM and GREM1 (deletion/duplication only). DNA and RNA analyses performed for * genes.   Based on Ms. Alba Destine Rubio's personal and family history of cancer, she meets medical criteria for genetic testing. She does not have health insurance, and was found to have cancer through a screening mammogram covered by the [facility] program.  We are applying for patient assistance through [facility] for this testing.    PLAN: After considering the risks, benefits, and limitations, Ms. Kerrin Mo provided informed consent to pursue genetic testing and the blood sample  was sent to [facility] for analysis of the CancerNext-Expanded+RNAinsight. Results should be available within approximately 2-3 weeks' time, at which point they will be disclosed by telephone to Ms. Kerrin Mo, as will any additional recommendations warranted by these results. Ms. Nayelie Gionfriddo will receive a summary of her genetic counseling visit and a copy of her results once available. This information will also be available in Epic.   Lastly, we encouraged Ms. Timiya Howells to remain in contact with cancer genetics annually so that we can continuously update the family history and inform her of any changes in cancer genetics and testing that may be of benefit for this family.   Ms. Alba Destine Rubio's questions were answered to her satisfaction today. Our contact information was provided should additional questions or concerns arise. Thank you for the referral and allowing [place] to share in the care of your patient.   Marieann Zipp P. Florene Glen, [place], [facility] Licensed, Insurance risk surveyor [email or web address] phone: [phone]  The patient was seen for a total of 40 minutes in face-to-face genetic counseling.  The patient was seen alone.  This patient was discussed with Drs. Magrinat, Lindi Adie and/or Burr Medico who agrees with the above.    _______________________________________________________________________ For Office Staff:  Number of people involved in session: 1 Was an Intern/ student involved with case: yes [employer]

## 2022-02-24 ENCOUNTER — Encounter: Payer: Self-pay | Admitting: *Deleted

## 2022-03-01 ENCOUNTER — Other Ambulatory Visit: Payer: Self-pay | Admitting: Surgery

## 2022-03-03 ENCOUNTER — Encounter: Payer: Self-pay | Admitting: Genetic Counselor

## 2022-03-03 ENCOUNTER — Encounter: Payer: Self-pay | Admitting: *Deleted

## 2022-03-03 ENCOUNTER — Telehealth: Payer: Self-pay | Admitting: Genetic Counselor

## 2022-03-03 DIAGNOSIS — Z1379 Encounter for other screening for genetic and chromosomal anomalies: Secondary | ICD-10-CM | POA: Insufficient documentation

## 2022-03-03 NOTE — Telephone Encounter (Signed)
Called the patient with the assistance of Lavon Paganini, Medical Interpreter.  Revealed negative genetic testing on the BRCAPlus STAT panel.  This negative result indicates that the high risk breast cancer genes do not have a hereditary pathogenic mutation that would place her at increased risk for another breast cancer.  We are waiting on the remainder of the testing.   ? ? ?

## 2022-03-17 ENCOUNTER — Telehealth: Payer: Self-pay | Admitting: Genetic Counselor

## 2022-03-17 NOTE — Telephone Encounter (Signed)
With assistance from Lavon Paganini, medical interpreter, I LM on VM that results are back and to please call. ?

## 2022-03-18 ENCOUNTER — Encounter: Payer: Self-pay | Admitting: *Deleted

## 2022-03-24 ENCOUNTER — Ambulatory Visit: Payer: Self-pay | Admitting: Genetic Counselor

## 2022-03-24 DIAGNOSIS — Z1379 Encounter for other screening for genetic and chromosomal anomalies: Secondary | ICD-10-CM

## 2022-03-24 NOTE — Progress Notes (Signed)
HPI:  [name] was previously seen in the [place] clinic due to a personal and family history of cancer and concerns regarding a hereditary predisposition to cancer. Please refer to our prior cancer genetics clinic note for more information regarding our discussion, assessment and recommendations, at the time. [name]'s recent genetic test results were disclosed to her, as were recommendations warranted by these results. These results and recommendations are discussed in more detail below. ? ?CANCER HISTORY:  ?Oncology History  ?Malignant neoplasm of upper-inner quadrant of right breast in female, estrogen receptor positive ([place])  ?01/20/2022 Initial Diagnosis  ? 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  ?01/26/2022 Cancer Staging  ? Staging form: Breast, AJCC 8th Edition ?- Clinical: Stage IB (cT2, cN0, cM0, G2, ER+, PR+, HER2-) - Signed by Gardenia Phlegm, NP on 01/26/2022 ?Histologic grading system: 3 grade system ? ?  ?03/09/2022 Genetic Testing  ? Negative genetic testing on the BRCAPlus panel.  The report date is March 01, 2022.  MUTYH, NF2 and TSC2 VUS's identified on the CancerNext-Expanded+RNAinsight panel.  These will not change medical management.  The report date is March 09, 2022. ? ?The CancerNext-Expanded gene panel offered by [facility] and includes sequencing and rearrangement analysis for the following 77 genes: AIP, ALK, APC*, ATM*, AXIN2, BAP1, BARD1, BLM, BMPR1A, BRCA1*, BRCA2*, BRIP1*, CDC73, CDH1*, CDK4, CDKN1B, CDKN2A, CHEK2*, CTNNA1, DICER1, FANCC, FH, FLCN, GALNT12, KIF1B, LZTR1, MAX, MEN1, MET, MLH1*, MSH2*, MSH3, MSH6*, MUTYH*, NBN, NF1*, NF2, NTHL1, PALB2*, PHOX2B, PMS2*, POT1, PRKAR1A, PTCH1, PTEN*, RAD51C*, RAD51D*, RB1, RECQL, RET, SDHA, SDHAF2, SDHB, SDHC, SDHD, SMAD4, SMARCA4, SMARCB1,  SMARCE1, STK11, SUFU, TMEM127, TP53*, TSC1, TSC2, VHL and XRCC2 (sequencing and deletion/duplication); EGFR, EGLN1, HOXB13, KIT, MITF, PDGFRA, POLD1, and POLE (sequencing only); EPCAM and GREM1 (deletion/duplication only). DNA and RNA analyses performed for * genes.  ?  ? ? ?FAMILY HISTORY:  ?We obtained a detailed, 4-generation family history.  Significant diagnoses are listed below: ?Family History  ?Problem Relation Age of Onset  ? Uterine cancer Cousin   ?     paternal first cousin  ? Breast cancer Neg Hx   ? ? ?The patient has a son and daughter who are cancer free.  She has three brothers and four sisters who are cancer free.  Both parents are living. ?  ?Her mother is living at 75.  She is an only child.  Her parents are deceased. ?  ?The patient's father is living.  He has three brothers and a sister who are cancer free.  One cousin was diagnosed with uterine cancer. ?  ?[name] is unaware of previous family history of genetic testing for hereditary cancer risks. Patient's maternal ancestors are of [place] descent, and paternal ancestors are of [place] descent. There is no reported Ashkenazi Jewish ancestry. There is no known consanguinity. ? ?GENETIC TEST RESULTS: Genetic testing reported out on March 09, 2022 through the CancerNext-Expanded+RNAinsight cancer panel found no pathogenic mutations. The CancerNext-Expanded gene panel offered by [facility] and includes sequencing and rearrangement analysis for the following 77 genes: AIP, ALK, APC*, ATM*, AXIN2, BAP1, BARD1, BLM, BMPR1A, BRCA1*, BRCA2*, BRIP1*, CDC73, CDH1*, CDK4, CDKN1B, CDKN2A, CHEK2*, CTNNA1, DICER1, FANCC, FH, FLCN, GALNT12, KIF1B, LZTR1, MAX, MEN1, MET, MLH1*, MSH2*, MSH3, MSH6*, MUTYH*, NBN, NF1*, NF2, NTHL1, PALB2*, PHOX2B, PMS2*, POT1, PRKAR1A, PTCH1, PTEN*, RAD51C*,  RAD51D*, RB1, RECQL, RET, SDHA, SDHAF2, SDHB, SDHC, SDHD, SMAD4, SMARCA4, SMARCB1, SMARCE1, STK11, SUFU, TMEM127, TP53*, TSC1, TSC2, VHL and XRCC2 (sequencing  and deletion/duplication); EGFR, EGLN1, HOXB13, KIT, MITF, PDGFRA, POLD1, and POLE (sequencing only); EPCAM and GREM1 (deletion/duplication only). DNA and RNA analyses performed for * genes. The test report has been scanned into EPIC and is located under the Molecular Pathology section of the Results Review tab.  A portion of the result report is included below for reference.  ? ? ? ?We discussed with [name] that because current genetic testing is not perfect, it is possible there may be a gene mutation in one of these genes that current testing cannot detect, but that chance is small.  We also discussed, that there could be another gene that has not yet been discovered, or that we have not yet tested, that is responsible for the cancer diagnoses in the family. It is also possible there is a hereditary cause for the cancer in the family that [name] did not inherit and therefore was not identified in her testing.  Therefore, it is important to remain in touch with cancer genetics in the future so that we can continue to offer [name] the most up to date genetic testing.  ? ?Genetic testing did identify three Variants of uncertain significance (VUS) - one in the MUTYH gene called c.607C>T, a second in the NF2 gene called c.1127G>A, and a third in the TSC2 gene called c.1987C>T.  At this time, it is unknown if these variants are associated with increased cancer risk or if they are normal findings, but most variants such as these get reclassified to being inconsequential. They should not be used to make medical management decisions. With time, we suspect the lab will determine the significance of these variants, if any. If we do learn more about them, we will try to contact [name] to discuss it further. However, it is important to stay in touch with [place] periodically and keep the address and phone number up to date. ? ?ADDITIONAL GENETIC TESTING: We discussed with [name] that her genetic testing was fairly extensive.  If there are genes identified to increase cancer risk that can be analyzed in the future, we would be happy to discuss and coordinate this testing at that time.   ? ?CANCER SCREENING RECOMMENDATIONS: [name]'s test result is considered negative (normal).  This means that we have not identified a hereditary cause for her personal and family history of cancer at this time. Most cancers happen by chance and this negative test suggests that her cancer may fall into this category.   ? ?While reassuring, this does not definitively rule out a hereditary predisposition to cancer. It is still possible that there could be genetic mutations that are undetectable by current technology. There could be genetic mutations in genes that have not been tested or identified to increase cancer risk.  Therefore, it is recommended she continue to follow the cancer management and screening guidelines provided by her oncology and primary healthcare provider.  ? ?An individual's cancer risk and medical management are not determined by genetic test results alone. Overall cancer risk assessment incorporates additional factors, including personal medical history, family history, and any available genetic information that may result in a personalized plan for cancer prevention and surveillance ? ?RECOMMENDATIONS FOR FAMILY MEMBERS:  Individuals in this family might be at some increased risk of developing cancer, over the general  population risk, simply due to the family history of cancer.  We recommended women in this family have a yearly mammogram beginning at age 21, or 81 years younger than the earliest onset of cancer, an annual clinical breast exam, and perform monthly breast self-exams. Women in this family should also have a gynecological exam as recommended by their primary provider. All family members should be referred for colonoscopy starting at age 67. ? ?FOLLOW-UP:  Lastly, we discussed with [name] that cancer genetics is a rapidly advancing field and it is possible that new genetic tests will be appropriate for her and/or her family members in the future. We enc

## 2022-03-24 NOTE — Telephone Encounter (Signed)
With assistance of Lavon Paganini, medical interpreter the following was revealed: ? ?Revealed negative genetic testing.  Discussed that we do not know why she has breast cancer or why there is cancer in the family. It could be due to a different gene that we are not testing, or maybe our current technology may not be able to pick something up.  It will be important for her to keep in contact with genetics to keep up with whether additional testing may be needed.  ? ?Three VUS were identified.  These will not change medical management. ? ? ?

## 2022-03-29 ENCOUNTER — Encounter: Payer: Self-pay | Admitting: *Deleted

## 2022-04-01 ENCOUNTER — Other Ambulatory Visit: Payer: Self-pay | Admitting: Surgery

## 2022-04-04 ENCOUNTER — Encounter: Payer: Self-pay | Admitting: *Deleted

## 2022-04-05 ENCOUNTER — Encounter: Payer: Self-pay | Admitting: *Deleted

## 2022-04-05 ENCOUNTER — Telehealth: Payer: Self-pay | Admitting: Hematology and Oncology

## 2022-04-05 ENCOUNTER — Other Ambulatory Visit: Payer: Self-pay | Admitting: Surgery

## 2022-04-05 DIAGNOSIS — Z853 Personal history of malignant neoplasm of breast: Secondary | ICD-10-CM

## 2022-04-05 NOTE — Telephone Encounter (Signed)
Per 4/18 in basket called pt using interpreter and left message about appointment.  Call back number was left if any changes are needed  ?

## 2022-04-11 ENCOUNTER — Other Ambulatory Visit: Payer: Self-pay

## 2022-04-11 ENCOUNTER — Encounter (HOSPITAL_BASED_OUTPATIENT_CLINIC_OR_DEPARTMENT_OTHER): Payer: Self-pay | Admitting: Surgery

## 2022-04-18 NOTE — Progress Notes (Signed)
? ?Patient Care Team: ?Pcp, No as PCP - General ?Mauro Kaufmann, RN as Oncology Nurse Navigator ?Rockwell Germany, RN as Oncology Nurse Navigator ? ?DIAGNOSIS:  ?Encounter Diagnosis  ?Name Primary?  ? Malignant neoplasm of upper-inner quadrant of right breast in female, estrogen receptor positive ([place])   ? ? ?SUMMARY OF ONCOLOGIC HISTORY: ?Oncology History  ?Malignant neoplasm of upper-inner quadrant of right breast in female, estrogen receptor positive ([place])  ?01/20/2022 Initial Diagnosis  ? 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  ?01/26/2022 Cancer Staging  ? Staging form: Breast, AJCC 8th Edition ?- Clinical: Stage IB (cT2, cN0, cM0, G2, ER+, PR+, HER2-) - Signed by Gardenia Phlegm, NP on 01/26/2022 ?Histologic grading system: 3 grade system ? ?  ?03/09/2022 Genetic Testing  ? Negative genetic testing on the BRCAPlus panel.  The report date is March 01, 2022.  MUTYH, NF2 and TSC2 VUS's identified on the CancerNext-Expanded+RNAinsight panel.  These will not change medical management.  The report date is March 09, 2022. ? ?The CancerNext-Expanded gene panel offered by [facility] and includes sequencing and rearrangement analysis for the following 77 genes: AIP, ALK, APC*, ATM*, AXIN2, BAP1, BARD1, BLM, BMPR1A, BRCA1*, BRCA2*, BRIP1*, CDC73, CDH1*, CDK4, CDKN1B, CDKN2A, CHEK2*, CTNNA1, DICER1, FANCC, FH, FLCN, GALNT12, KIF1B, LZTR1, MAX, MEN1, MET, MLH1*, MSH2*, MSH3, MSH6*, MUTYH*, NBN, NF1*, NF2, NTHL1, PALB2*, PHOX2B, PMS2*, POT1, PRKAR1A, PTCH1, PTEN*, RAD51C*, RAD51D*, RB1, RECQL, RET, SDHA, SDHAF2, SDHB, SDHC, SDHD, SMAD4, SMARCA4, SMARCB1, SMARCE1, STK11, SUFU, TMEM127, TP53*, TSC1, TSC2, VHL and XRCC2 (sequencing and deletion/duplication); EGFR, EGLN1, HOXB13, KIT, MITF, PDGFRA, POLD1, and POLE (sequencing only); EPCAM and GREM1 (deletion/duplication  only). DNA and RNA analyses performed for * genes.  ?  ?04/20/2022 Surgery  ? Right lumpectomy: Grade 2 IDC with DCIS 2.1 cm, extends into the dermis of the nipple, margins negative, 1/11 lymph nodes positive ER 90%, PR 90%, HER2 negative, Ki-67 1% ?  ?05/02/2022 Cancer Staging  ? Staging form: Breast, AJCC 8th Edition ?- Pathologic: Stage IB (pT2, pN1, cM0, G2, ER+, PR+, HER2-) - Signed by Nicholas Lose, MD on 05/02/2022 ?Stage prefix: Initial diagnosis ?Histologic grading system: 3 grade system ? ?  ? ? ?CHIEF COMPLIANT: Follow-up of  pathology report. ? ?INTERVAL HISTORY: Debra Hobbs is a 46 y.o. female is here because of recent diagnosis of invasive mammary carcinoma of the right breast. She presents to the clinic today for a follow-up. She complains of some numbness. She state that she has pain She state that she is having some pain in mid back and also around the right side chest around the nipple.  ? ?ALLERGIES:  has No Known Allergies. ? ?MEDICATIONS:  ?Current Outpatient Medications  ?Medication Sig Dispense Refill  ? oxyCODONE (OXY IR/ROXICODONE) 5 MG immediate release tablet Take 1 tablet (5 mg total) by mouth every 6 (six) hours as needed for moderate pain, severe pain or breakthrough pain. 25 tablet 0  ? ?No current facility-administered medications for this visit.  ? ? ?PHYSICAL EXAMINATION: ?ECOG PERFORMANCE STATUS: 1 - Symptomatic but completely ambulatory ? ?Vitals:  ? 05/02/22 1345  ?BP: 102/61  ?Pulse: 70  ?Resp: 16  ?Temp: 97.7 ?F (36.5 ?C)  ?SpO2: 100%  ? ?Filed Weights  ? 05/02/22 1345  ?Weight: 122 lb 11.2 oz (55.7 kg)  ?  ? ?LABORATORY DATA:  ?I have reviewed the  data as listed ?   ? View : No data to display.  ?  ?  ?  ? ? ?No results found for: WBC, HGB, HCT, MCV, PLT, NEUTROABS ? ?ASSESSMENT & PLAN:  ?Malignant neoplasm of upper-inner quadrant of right breast in female, estrogen receptor positive ([place]) ?Right lumpectomy: Grade 2 IDC with DCIS 2.1 cm, extends into the dermis of the  nipple, margins negative, 1/11 lymph nodes positive ER 90%, PR 90%, HER2 negative, Ki-67 1% ?T2N1 stage Ib ? ?Pathology counseling: I discussed the final pathology report of the patient provided  a copy of this report. I discussed the margins as well as lymph node surgeries. We also discussed the final staging along with previously performed ER/PR and HER-2/neu testing. ?I discussed with her that the involvement of the nipple and the dermis of the nipple is a concern.  Surgery will have to determine if any additional surgery is needed. ? ?Treatment plan: ?1.  Mammaprint testing ?2. adjuvant radiation ?3.  Adjuvant antiestrogen therapy ? ?Mammaprint counseling: MINDACT is a prospective, randomized phase III controlled trial that investigates the clinical utility of MammaPrint, when compared to standard clinical pathological criteria, with 6,693 patients enrolled from over 111 institutions. ?Clinical high-risk patients with a Low Risk MammaPrint result, including 48% node-positive, had 5-year distant metastasis-free survival rate in excess of 94 percent, whether randomized to receive adjuvant chemotherapy or not proving MammaPrint?s ability to safely identify Low Risk patients. ? ? ?Return to clinic based upon MammaPrint test result. ? ? ? ? ? ?No orders of the defined types were placed in this encounter. ? ?The patient has a good understanding of the overall plan. she agrees with it. she will call with any problems that may develop before the next visit here. ?Total time spent: 30 mins including face to face time and time spent for planning, charting and co-ordination of care ? ? Harriette Ohara, MD ?05/02/22 ? ? ? I Gardiner Coins am scribing for Dr. Lindi Adie ? ?I have reviewed the above documentation for accuracy and completeness, and I agree with the above. ?  ?

## 2022-04-19 ENCOUNTER — Ambulatory Visit
Admission: RE | Admit: 2022-04-19 | Discharge: 2022-04-19 | Disposition: A | Discharge status: Home / Self Care | Payer: No Typology Code available for payment source | Source: Ambulatory Visit | Attending: Surgery | Admitting: Surgery

## 2022-04-19 DIAGNOSIS — Z853 Personal history of malignant neoplasm of breast: Secondary | ICD-10-CM

## 2022-04-19 MED ORDER — ENSURE PRE-SURGERY PO LIQD: 296.0000 mL | Freq: Once | ORAL | Status: DC

## 2022-04-19 NOTE — Progress Notes (Signed)

## 2022-04-19 NOTE — H&P (Signed)
?  PROVIDER:  Beverlee Nims, MD ?  ?MRN: [id number] ?DOB: June 17, 1976 ? ?Subjective  ?   ?Chief Complaint: Follow-up (Recheck rt breast ) ?  ?  ?  ?History of Present Illness: ?Debra Hobbs is a 46 y.o. female who is seen today for a follow-up regarding her right breast cancer.  This discussion was done through a phone interpreter ?  ?She has now seen medical oncology and has had an MRI of her breast.  This shows that the mass in the right breast is 3.5 cm in size and intimately involved with the nipple areolar complex retracting the nipple.  There were no other abnormalities on either side and there were no enlarged lymph nodes. ?  ?  ?Review of Systems: ?A complete review of systems was obtained from the patient.  I have reviewed this information and discussed as appropriate with the patient.  See HPI as well for other ROS. ?  ?ROS  ?  ?  ?Medical History: ?Past Medical History  ?History reviewed. No pertinent past medical history.  ? ?  ?There is no problem list on file for this patient. ?  ?  ?Past Surgical History  ?History reviewed. No pertinent surgical history.  ?  ?  ?Allergies  ?No Known Allergies  ? ?  ?No current outpatient medications on file prior to visit.  ?  ?No current facility-administered medications on file prior to visit.  ?  ?  ?Family History  ?History reviewed. No pertinent family history.  ?  ?  ?Social History  ?  ?   ?Tobacco Use  ?Smoking Status Never  ?Smokeless Tobacco Never  ?  ?  ?Social History  ?Social History  ?  ?    ?Socioeconomic History  ? Marital status: Single  ?Tobacco Use  ? Smoking status: Never  ? Smokeless tobacco: Never  ?Substance and Sexual Activity  ? Alcohol use: Not Currently  ? Drug use: Never  ?  ?  ?  ?Objective:  ?  ?  ?There were no vitals filed for this visit.  ?There is no height or weight on file to calculate BMI. ?  ?Physical Exam  ?  ?Again, on exam there is a palpable mass in the central portion of her breast underneath the nipple with  nipple retraction.  There is no axillary adenopathy ?  ?  ?Labs, Imaging and Diagnostic Testing: ?  ?I reviewed her MRI and the notes from her oncologist ?  ?  ?Assessment and Plan:  ?   ?Diagnoses and all orders for this visit: ?  ?Malignant neoplasm involving both nipple and areola of right breast in female, estrogen receptor positive (CMS-HCC) ?  ?  ?  ?At this point I had a long discussion with the patient through the interpreter.  Given the location of the cancer, whether or not she has a mastectomy or lumpectomy, we would have to remove the nipple areolar complex.  With a lumpectomy, she was to require radiation therapy.  She also needs a sentinel node biopsy.  After long discussion she wishes to proceed with a central lumpectomy and sentinel node biopsy.  We discussed the risks which include but are not limited to bleeding, infection, the need for further surgery if margins or lymph nodes are positive, seroma formation, chronic discomfort, cardiopulmonary issues, etc.  Surgery will be scheduled ?  ?

## 2022-04-20 ENCOUNTER — Ambulatory Visit (HOSPITAL_BASED_OUTPATIENT_CLINIC_OR_DEPARTMENT_OTHER)
Admission: RE | Admit: 2022-04-20 | Discharge: 2022-04-20 | Disposition: A | Discharge status: Home / Self Care | Payer: No Typology Code available for payment source | Attending: Surgery | Admitting: Surgery

## 2022-04-20 ENCOUNTER — Ambulatory Visit
Admission: RE | Admit: 2022-04-20 | Discharge: 2022-04-20 | Disposition: A | Discharge status: Home / Self Care | Payer: No Typology Code available for payment source | Source: Ambulatory Visit | Attending: Surgery | Admitting: Surgery

## 2022-04-20 ENCOUNTER — Encounter (HOSPITAL_BASED_OUTPATIENT_CLINIC_OR_DEPARTMENT_OTHER)
Admission: RE | Disposition: A | Discharge status: Home / Self Care | Payer: Self-pay | Source: Home / Self Care | Attending: Surgery

## 2022-04-20 ENCOUNTER — Other Ambulatory Visit: Payer: Self-pay

## 2022-04-20 ENCOUNTER — Ambulatory Visit (HOSPITAL_BASED_OUTPATIENT_CLINIC_OR_DEPARTMENT_OTHER): Payer: No Typology Code available for payment source | Admitting: Anesthesiology

## 2022-04-20 ENCOUNTER — Encounter: Payer: Self-pay | Admitting: Hematology and Oncology

## 2022-04-20 ENCOUNTER — Encounter (HOSPITAL_BASED_OUTPATIENT_CLINIC_OR_DEPARTMENT_OTHER): Payer: Self-pay | Admitting: Surgery

## 2022-04-20 DIAGNOSIS — C50911 Malignant neoplasm of unspecified site of right female breast: Secondary | ICD-10-CM

## 2022-04-20 DIAGNOSIS — Z170 Estrogen receptor positive status [ER+]: Secondary | ICD-10-CM | POA: Insufficient documentation

## 2022-04-20 DIAGNOSIS — Z853 Personal history of malignant neoplasm of breast: Secondary | ICD-10-CM

## 2022-04-20 DIAGNOSIS — C773 Secondary and unspecified malignant neoplasm of axilla and upper limb lymph nodes: Secondary | ICD-10-CM | POA: Insufficient documentation

## 2022-04-20 DIAGNOSIS — C50011 Malignant neoplasm of nipple and areola, right female breast: Secondary | ICD-10-CM | POA: Insufficient documentation

## 2022-04-20 HISTORY — PX: BREAST LUMPECTOMY WITH RADIOACTIVE SEED AND SENTINEL LYMPH NODE BIOPSY: SHX6550

## 2022-04-20 HISTORY — DX: Malignant (primary) neoplasm, unspecified: C80.1

## 2022-04-20 LAB — POCT PREGNANCY, URINE: Preg Test, Ur: NEGATIVE

## 2022-04-20 SURGERY — BREAST LUMPECTOMY WITH RADIOACTIVE SEED AND SENTINEL LYMPH NODE BIOPSY
Anesthesia: General | Site: Breast | Laterality: Right

## 2022-04-20 MED ORDER — LACTATED RINGERS IV SOLN: INTRAVENOUS | Status: DC

## 2022-04-20 MED ORDER — ATROPINE SULFATE 0.4 MG/ML IV SOLN
INTRAVENOUS | Status: AC
  Filled 2022-04-20: qty 1

## 2022-04-20 MED ORDER — ACETAMINOPHEN 500 MG PO TABS
1000.0000 mg | ORAL_TABLET | ORAL | Status: AC
  Administered 2022-04-20: 1000 mg via ORAL

## 2022-04-20 MED ORDER — DEXAMETHASONE SODIUM PHOSPHATE 10 MG/ML IJ SOLN
INTRAMUSCULAR | Status: AC
  Filled 2022-04-20: qty 1

## 2022-04-20 MED ORDER — ACETAMINOPHEN 500 MG PO TABS
ORAL_TABLET | ORAL | Status: AC
  Filled 2022-04-20: qty 2

## 2022-04-20 MED ORDER — DEXAMETHASONE SODIUM PHOSPHATE 4 MG/ML IJ SOLN
INTRAMUSCULAR | Status: DC | PRN
  Administered 2022-04-20: 5 mg via INTRAVENOUS

## 2022-04-20 MED ORDER — FENTANYL CITRATE (PF) 100 MCG/2ML IJ SOLN
25.0000 ug | INTRAMUSCULAR | Status: DC | PRN
  Administered 2022-04-20 (×3): 25 ug via INTRAVENOUS

## 2022-04-20 MED ORDER — PROPOFOL 10 MG/ML IV BOLUS
INTRAVENOUS | Status: DC | PRN
  Administered 2022-04-20: 150 mg via INTRAVENOUS

## 2022-04-20 MED ORDER — MIDAZOLAM HCL 2 MG/2ML IJ SOLN
2.0000 mg | Freq: Once | INTRAMUSCULAR | Status: AC
  Administered 2022-04-20: 2 mg via INTRAVENOUS

## 2022-04-20 MED ORDER — CHLORHEXIDINE GLUCONATE CLOTH 2 % EX PADS
6.0000 | MEDICATED_PAD | Freq: Once | CUTANEOUS | Status: AC
  Administered 2022-04-20: 6 via TOPICAL

## 2022-04-20 MED ORDER — BUPIVACAINE-EPINEPHRINE (PF) 0.25% -1:200000 IJ SOLN
INTRAMUSCULAR | Status: DC | PRN
  Administered 2022-04-20: 30 mL

## 2022-04-20 MED ORDER — FENTANYL CITRATE (PF) 100 MCG/2ML IJ SOLN
INTRAMUSCULAR | Status: AC
  Filled 2022-04-20: qty 2

## 2022-04-20 MED ORDER — MIDAZOLAM HCL 2 MG/2ML IJ SOLN
INTRAMUSCULAR | Status: AC
  Filled 2022-04-20: qty 2

## 2022-04-20 MED ORDER — LIDOCAINE HCL (CARDIAC) PF 100 MG/5ML IV SOSY
PREFILLED_SYRINGE | INTRAVENOUS | Status: DC | PRN
  Administered 2022-04-20: 10 mg via INTRAVENOUS

## 2022-04-20 MED ORDER — FENTANYL CITRATE (PF) 100 MCG/2ML IJ SOLN
100.0000 ug | Freq: Once | INTRAMUSCULAR | Status: AC
  Administered 2022-04-20: 50 ug via INTRAVENOUS

## 2022-04-20 MED ORDER — CEFAZOLIN SODIUM-DEXTROSE 2-4 GM/100ML-% IV SOLN
INTRAVENOUS | Status: AC
  Filled 2022-04-20: qty 100

## 2022-04-20 MED ORDER — LIDOCAINE 2% (20 MG/ML) 5 ML SYRINGE
INTRAMUSCULAR | Status: AC
  Filled 2022-04-20: qty 5

## 2022-04-20 MED ORDER — PHENYLEPHRINE 80 MCG/ML (10ML) SYRINGE FOR IV PUSH (FOR BLOOD PRESSURE SUPPORT)
PREFILLED_SYRINGE | INTRAVENOUS | Status: AC
  Filled 2022-04-20: qty 10

## 2022-04-20 MED ORDER — MAGTRACE LYMPHATIC TRACER
INTRAMUSCULAR | Status: DC | PRN
Start: 2022-04-20 — End: 2022-04-20
  Administered 2022-04-20: 2 mL via INTRAMUSCULAR

## 2022-04-20 MED ORDER — ONDANSETRON HCL 4 MG/2ML IJ SOLN
INTRAMUSCULAR | Status: DC | PRN
  Administered 2022-04-20: 4 mg via INTRAVENOUS

## 2022-04-20 MED ORDER — OXYCODONE HCL 5 MG PO TABS: 5.0000 mg | ORAL_TABLET | Freq: Four times a day (QID) | ORAL | 0 refills | Status: DC | PRN

## 2022-04-20 MED ORDER — CEFAZOLIN SODIUM-DEXTROSE 2-4 GM/100ML-% IV SOLN
2.0000 g | INTRAVENOUS | Status: AC
  Administered 2022-04-20: 2 g via INTRAVENOUS

## 2022-04-20 MED ORDER — CLONIDINE HCL (ANALGESIA) 100 MCG/ML EP SOLN
EPIDURAL | Status: DC | PRN
  Administered 2022-04-20: 50 ug

## 2022-04-20 MED ORDER — CHLORHEXIDINE GLUCONATE CLOTH 2 % EX PADS: 6.0000 | MEDICATED_PAD | Freq: Once | CUTANEOUS | Status: DC

## 2022-04-20 MED ORDER — SUCCINYLCHOLINE CHLORIDE 200 MG/10ML IV SOSY
PREFILLED_SYRINGE | INTRAVENOUS | Status: AC
  Filled 2022-04-20: qty 10

## 2022-04-20 MED ORDER — AMISULPRIDE (ANTIEMETIC) 5 MG/2ML IV SOLN: 10.0000 mg | Freq: Once | INTRAVENOUS | Status: DC | PRN

## 2022-04-20 MED ORDER — EPHEDRINE SULFATE (PRESSORS) 50 MG/ML IJ SOLN
INTRAMUSCULAR | Status: DC | PRN
  Administered 2022-04-20: 10 mg via INTRAVENOUS

## 2022-04-20 MED ORDER — FENTANYL CITRATE (PF) 100 MCG/2ML IJ SOLN
INTRAMUSCULAR | Status: DC | PRN
  Administered 2022-04-20 (×2): 50 ug via INTRAVENOUS

## 2022-04-20 MED ORDER — ONDANSETRON HCL 4 MG/2ML IJ SOLN
INTRAMUSCULAR | Status: AC
  Filled 2022-04-20: qty 2

## 2022-04-20 MED ORDER — BUPIVACAINE-EPINEPHRINE 0.5% -1:200000 IJ SOLN
INTRAMUSCULAR | Status: DC | PRN
  Administered 2022-04-20: 20 mL

## 2022-04-20 MED ORDER — EPHEDRINE 5 MG/ML INJ
INTRAVENOUS | Status: AC
  Filled 2022-04-20: qty 5

## 2022-04-20 SURGICAL SUPPLY — 47 items
ADH SKN CLS APL DERMABOND .7 (GAUZE/BANDAGES/DRESSINGS) ×1
APL PRP STRL LF DISP 70% ISPRP (MISCELLANEOUS) ×1
APPLIER CLIP 9.375 MED OPEN (MISCELLANEOUS) ×2
APR CLP MED 9.3 20 MLT OPN (MISCELLANEOUS) ×1
BINDER BREAST LRG (GAUZE/BANDAGES/DRESSINGS) ×1 IMPLANT
BLADE SURG 15 STRL LF DISP TIS (BLADE) ×1 IMPLANT
BLADE SURG 15 STRL SS (BLADE) ×2
CANISTER SUCT 1200ML W/VALVE (MISCELLANEOUS) IMPLANT
CHLORAPREP W/TINT 26 (MISCELLANEOUS) ×2 IMPLANT
CLIP APPLIE 9.375 MED OPEN (MISCELLANEOUS) ×1 IMPLANT
CLIP TI WIDE RED SMALL 6 (CLIP) IMPLANT
COVER BACK TABLE 60X90IN (DRAPES) ×2 IMPLANT
COVER MAYO STAND STRL (DRAPES) ×2 IMPLANT
COVER PROBE W GEL 5X96 (DRAPES) ×2 IMPLANT
DERMABOND ADVANCED (GAUZE/BANDAGES/DRESSINGS) ×1
DERMABOND ADVANCED .7 DNX12 (GAUZE/BANDAGES/DRESSINGS) ×1 IMPLANT
DRAPE LAPAROSCOPIC ABDOMINAL (DRAPES) ×2 IMPLANT
DRAPE UTILITY XL STRL (DRAPES) ×2 IMPLANT
ELECT REM PT RETURN 9FT ADLT (ELECTROSURGICAL) ×2
ELECTRODE REM PT RTRN 9FT ADLT (ELECTROSURGICAL) ×1 IMPLANT
GAUZE SPONGE 4X4 12PLY STRL LF (GAUZE/BANDAGES/DRESSINGS) IMPLANT
GLOVE SURG SIGNA 7.5 PF LTX (GLOVE) ×2 IMPLANT
GOWN STRL REUS W/ TWL LRG LVL3 (GOWN DISPOSABLE) ×1 IMPLANT
GOWN STRL REUS W/ TWL XL LVL3 (GOWN DISPOSABLE) ×1 IMPLANT
GOWN STRL REUS W/TWL LRG LVL3 (GOWN DISPOSABLE) ×2
GOWN STRL REUS W/TWL XL LVL3 (GOWN DISPOSABLE) ×2
KIT MARKER MARGIN INK (KITS) ×2 IMPLANT
NDL HYPO 25X1 1.5 SAFETY (NEEDLE) ×1 IMPLANT
NDL SAFETY ECLIPSE 18X1.5 (NEEDLE) ×1 IMPLANT
NEEDLE HYPO 18GX1.5 SHARP (NEEDLE) ×2
NEEDLE HYPO 25X1 1.5 SAFETY (NEEDLE) ×2 IMPLANT
NS IRRIG 1000ML POUR BTL (IV SOLUTION) ×2 IMPLANT
PACK BASIN DAY SURGERY FS (CUSTOM PROCEDURE TRAY) ×2 IMPLANT
PENCIL SMOKE EVACUATOR (MISCELLANEOUS) ×2 IMPLANT
SLEEVE SCD COMPRESS KNEE MED (STOCKING) ×2 IMPLANT
SPIKE FLUID TRANSFER (MISCELLANEOUS) IMPLANT
SPONGE T-LAP 4X18 ~~LOC~~+RFID (SPONGE) ×2 IMPLANT
SUT MNCRL AB 4-0 PS2 18 (SUTURE) ×2 IMPLANT
SUT SILK 2 0 SH (SUTURE) IMPLANT
SUT VIC AB 3-0 SH 27 (SUTURE) ×2
SUT VIC AB 3-0 SH 27X BRD (SUTURE) ×1 IMPLANT
SYR CONTROL 10ML LL (SYRINGE) ×2 IMPLANT
TOWEL GREEN STERILE FF (TOWEL DISPOSABLE) ×2 IMPLANT
TRACER MAGTRACE VIAL (MISCELLANEOUS) IMPLANT
TRAY FAXITRON CT DISP (TRAY / TRAY PROCEDURE) ×2 IMPLANT
TUBE CONNECTING 20X1/4 (TUBING) IMPLANT
YANKAUER SUCT BULB TIP NO VENT (SUCTIONS) IMPLANT

## 2022-04-20 NOTE — Anesthesia Postprocedure Evaluation (Signed)
Anesthesia Post Note ? ?Patient: Debra Hobbs ? ?Procedure(s) Performed: RIGHT BREAST LUMPECTOMY WITH RADIOACTIVE SEED AND SENTINEL LYMPH NODE BIOPSY (Right: Breast) ? ?  ? ?Patient location during evaluation: PACU ?Anesthesia Type: General ?Level of consciousness: awake and alert ?Pain management: pain level controlled ?Vital Signs Assessment: post-procedure vital signs reviewed and stable ?Respiratory status: spontaneous breathing, nonlabored ventilation, respiratory function stable and patient connected to nasal cannula oxygen ?Cardiovascular status: blood pressure returned to baseline and stable ?Postop Assessment: no apparent nausea or vomiting ?Anesthetic complications: no ? ? ?No notable events documented. ? ?Last Vitals:  ?Vitals:  ? 04/20/22 1245 04/20/22 1300  ?BP: 126/71 124/74  ?Pulse: 93 81  ?Resp: 13 (!) 9  ?Temp:    ?SpO2: 98% 98%  ?  ?Last Pain:  ?Vitals:  ? 04/20/22 1245  ?TempSrc:   ?PainSc: 4   ? ? ?  ?  ?  ?  ?  ?  ? ?Suzette Battiest E ? ? ? ? ?

## 2022-04-20 NOTE — Anesthesia Preprocedure Evaluation (Signed)
Anesthesia Evaluation  ?Patient identified by MRN, date of birth, ID band ?Patient awake ? ? ? ?Reviewed: ?Allergy & Precautions, NPO status , Patient's Chart, lab work & pertinent test results ? ?Airway ?Mallampati: II ? ?TM Distance: >3 FB ?Neck ROM: Full ? ? ? Dental ? ?(+) Dental Advisory Given ?  ?Pulmonary ?neg pulmonary ROS,  ?  ?breath sounds clear to auscultation ? ? ? ? ? ? Cardiovascular ?negative cardio ROS ? ? ?Rhythm:Regular Rate:Normal ? ? ?  ?Neuro/Psych ?negative neurological ROS ?   ? GI/Hepatic ?negative GI ROS, Neg liver ROS,   ?Endo/Other  ?negative endocrine ROS ? Renal/GU ?negative Renal ROS  ? ?  ?Musculoskeletal ? ? Abdominal ?  ?Peds ? Hematology ?negative hematology ROS ?(+)   ?Anesthesia Other Findings ? ? Reproductive/Obstetrics ? ?  ? ? ? ? ? ? ? ? ? ? ? ? ? ?  ?  ? ? ? ? ? ? ? ? ?Anesthesia Physical ?Anesthesia Plan ? ?ASA: 2 ? ?Anesthesia Plan: General  ? ?Post-op Pain Management: Regional block*, Tylenol PO (pre-op)* and Toradol IV (intra-op)*  ? ?Induction:  ? ?PONV Risk Score and Plan: 3 and Midazolam, Dexamethasone, Ondansetron and Treatment may vary due to age or medical condition ? ?Airway Management Planned: LMA ? ?Additional Equipment:  ? ?Intra-op Plan:  ? ?Post-operative Plan: Extubation in OR ? ?Informed Consent: I have reviewed the patients History and Physical, chart, labs and discussed the procedure including the risks, benefits and alternatives for the proposed anesthesia with the patient or authorized representative who has indicated his/her understanding and acceptance.  ? ? ? ?Dental advisory given ? ?Plan Discussed with: CRNA ? ?Anesthesia Plan Comments:   ? ? ? ? ? ? ?Anesthesia Quick Evaluation ? ?

## 2022-04-20 NOTE — Anesthesia Procedure Notes (Signed)
Anesthesia Regional Block: Pectoralis block  ? ?Pre-Anesthetic Checklist: , timeout performed,  Correct Patient, Correct Site, Correct Laterality,  Correct Procedure, Correct Position, site marked,  Risks and benefits discussed,  Surgical consent,  Pre-op evaluation,  At surgeon's request and post-op pain management ? ?Laterality: Right ? ?Prep: chloraprep     ?  ?Needles:  ?Injection technique: Single-shot ? ?Needle Type: Echogenic Needle   ? ? ?Needle Length: 9cm  ?Needle Gauge: 21  ? ? ? ?Additional Needles: ? ? ?Procedures:,,,, ultrasound used (permanent image in chart),,    ?Narrative:  ?Start time: 04/20/2022 10:00 AM ?End time: 04/20/2022 10:08 AM ?Injection made incrementally with aspirations every 5 mL. ? ?Performed by: Personally  ?Anesthesiologist: Suzette Battiest, MD ? ? ? ? ?

## 2022-04-20 NOTE — Op Note (Signed)
? ?Debra Hobbs ?04/20/2022 ? ? ?Pre-op Diagnosis: RIGHT BREAST CANCER ?    ?Post-op Diagnosis: same ? ?Procedure(s): ?RIGHT BREAST CENTRAL LUMPECTOMY WITH RADIOACTIVE SEED  ?DEEP RIGHT AXILLARY SENTINEL LYMPH NODE BIOPSY ?INJECTION OF MAGTRACE FOR LYMPH NODE MAPPING ? ?Surgeon(s): ?Coralie Keens, MD ?Lucita Ferrara, PA-C ? ?Anesthesia: General ? ?Staff:  ?Circulator: Patric Dykes, RN ?Relief Circulator: Izora Ribas, RN ?Scrub Person: Ward, Scarlette Calico ? ?Estimated Blood Loss: Minimal ?              ?Specimens: SENT TO PATH ? ?Indications: This is a 46 year old female who presented with an invasive right breast cancer.  It measured at least 3.7 cm in size with extension up to the nipple areolar complex with retraction of the nipple.  After discussion with the patient regarding surgical options which included mastectomy versus a central lumpectomy, she wished to proceed with a central lumpectomy and lymph node biopsy ? ?Procedure: The patient was brought to operating room identifies a correct patient.  She was placed upon the operating table general anesthesia was induced.  Her right breast and axilla were then prepped and draped in the usual sterile fashion.  Prior to prepping, I injected mag trace with a 25-gauge needle under the nipple areolar complex and massaged the breast for 5 minutes.  She had a radioactive seed placed at the deepest area of the central breast cancer.  A performed elliptical incision around the nipple areolar complex with a scalpel.  I then dissected down circumferentially to the breast tissue.  The radioactive seed was located at the 12 to 1 o'clock position of the breast adjacent to the nipple areolar complex.  I stayed widely around this area going down to almost the chest wall with the electrocautery.  I then completed the central lumpectomy removing the nipple areolar complex and underlying breast tissue.  All margins were then marked with paint.  The specimen  was x-rayed and the radioactive seed and tissue marker from the biopsy were confirmed to be in the specimen.  The specimen was then sent to pathology for evaluation. ?I next identified an area of increased uptake in the right axilla with the mag trace probe.  I anesthetized the skin with Marcaine and then made an incision in the right axilla with a scalpel.  I dissected into the deep axillary tissue and found 1 enlarged lymph node with uptake of the mag trace.  This was removed with surrounding lymph nodes and sent to pathology.  Several more small lymph nodes were removed and a separate section of fatty tissue with increased uptake of the mag trace as well.  This was sent to pathology for evaluation.  There was no further uptake in the axilla and no other palpable lymph nodes identified.  1 small bridging vein was controlled with surgical clips.  We placed surgical clips around the periphery of the lumpectomy cavity.  We then closed the lumpectomy cavity in layers with interrupted 3-0 Vicryl sutures and closed skin with running 4-0 Monocryl.  The incision in the axilla was also closed with 3-0 Vicryl and 4-0 Monocryl sutures.  Dermabond was applied.  The patient was next placed in a breast binder.  The patient tolerated the procedure well.  All the counts were correct at the end of the procedure.  The patient was then extubated in the operating room and taken in a stable condition to the recovery room. ?        ? ?Coralie Keens  ? ?  Date: 04/20/2022  Time: 11:44 AM ? ? ? ?

## 2022-04-20 NOTE — Discharge Instructions (Addendum)
[facility],Debra Hobbs ?Office Phone Number [phone] ? ?BREAST BIOPSY/ PARTIAL MASTECTOMY: POST OP INSTRUCTIONS ? ?Always review your discharge instruction sheet given to you by the facility where your Hobbs was performed. ? ?IF YOU HAVE DISABILITY OR FAMILY LEAVE FORMS, YOU MUST BRING THEM TO THE OFFICE FOR PROCESSING.  DO NOT GIVE THEM TO YOUR DOCTOR. ? ?A prescription for pain medication may be given to you upon discharge.  Take your pain medication as prescribed, if needed.  If narcotic pain medicine is not needed, then you may take acetaminophen (Tylenol) or ibuprofen (Advil) as needed. ?Take your usually prescribed medications unless otherwise directed ?If you need a refill on your pain medication, please contact your pharmacy.  They will contact our office to request authorization.  Prescriptions will not be filled after 5pm or on week-ends. ?You should eat very light the first 24 hours after Hobbs, such as soup, crackers, pudding, etc.  Resume your normal diet the day after Hobbs. ?Most patients will experience some swelling and bruising in the breast.  Ice packs and a good support bra will help.  Swelling and bruising can take several days to resolve.  ?It is common to experience some constipation if taking pain medication after Hobbs.  Increasing fluid intake and taking a stool softener will usually help or prevent this problem from occurring.  A mild laxative (Milk of Magnesia or Miralax) should be taken according to package directions if there are no bowel movements after 48 hours. ?Unless discharge instructions indicate otherwise, you may remove your bandages 24-48 hours after Hobbs, and you may shower at that time.  You may have steri-strips (small skin tapes) in place directly over the incision.  These strips should be left on the skin for 7-10 days.  If your surgeon used skin glue on the incision, you may shower in 24 hours.  The glue will flake off over the next 2-3 weeks.  Any  sutures or staples will be removed at the office during your follow-up visit. ?ACTIVITIES:  You may resume regular daily activities (gradually increasing) beginning the next day.  Wearing a good support bra or sports bra minimizes pain and swelling.  You may have sexual intercourse when it is comfortable. ?You may drive when you no longer are taking prescription pain medication, you can comfortably wear a seatbelt, and you can safely maneuver your car and apply brakes. ?RETURN TO WORK:  ______________________________________________________________________________________ ?You should see your doctor in the office for a follow-up appointment approximately two weeks after your Hobbs.  Your doctor?s nurse will typically make your follow-up appointment when she calls you with your pathology report.  Expect your pathology report 2-3 business days after your Hobbs.  You may call to check if you do not hear from [place] after three days. ?OTHER INSTRUCTIONS: YOU MAY REMOVE THE BINDER AND SHOWER STARTING TOMORROW ?ICE PACK, TYLENOL, AND IBUPROFEN ALSO FOR PAIN ?NO VIGOROUS ACTIVITY FOR ONE WEEK ?_______________________________________________________________________________________________ _____________________________________________________________________________________________________________________________________ ?_____________________________________________________________________________________________________________________________________ ?_____________________________________________________________________________________________________________________________________ ? ?WHEN TO CALL YOUR DOCTOR: ?Fever over 101.0 ?Nausea and/or vomiting. ?Extreme swelling or bruising. ?Continued bleeding from incision. ?Increased pain, redness, or drainage from the incision. ? ?The clinic staff is available to answer your questions during regular business hours.  Please don?t hesitate to call and ask to speak to one of the  nurses for clinical concerns.  If you have a medical emergency, go to the nearest emergency room or call 911.  A surgeon from [facility] is always on call at the hospital. ? ?For  further questions, please visit [email or web address]   ? ? ?Post Anesthesia Home Care Instructions ? ?Activity: ?Get plenty of rest for the remainder of the day. A responsible individual must stay with you for 24 hours following the procedure.  ?For the next 24 hours, DO NOT: ?-Drive a car ?-Paediatric nurse ?-Drink alcoholic beverages ?-Take any medication unless instructed by your physician ?-Make any legal decisions or sign important papers. ? ?Meals: ?Start with liquid foods such as gelatin or soup. Progress to regular foods as tolerated. Avoid greasy, spicy, heavy foods. If nausea and/or vomiting occur, drink only clear liquids until the nausea and/or vomiting subsides. Call your physician if vomiting continues. ? ?Special Instructions/Symptoms: ?Your throat may feel dry or sore from the anesthesia or the breathing tube placed in your throat during Hobbs. If this causes discomfort, gargle with warm salt water. The discomfort should disappear within 24 hours. ? ?If you had a scopolamine patch placed behind your ear for the management of post- operative nausea and/or vomiting: ? ?1. The medication in the patch is effective for 72 hours, after which it should be removed.  Wrap patch in a tissue and discard in the trash. Wash hands thoroughly with soap and water. ?2. You may remove the patch earlier than 72 hours if you experience unpleasant side effects which may include dry mouth, dizziness or visual disturbances. ?3. Avoid touching the patch. Wash your hands with soap and water after contact with the patch. ?    ? ?No tylenol until after 4pm if needed today. ?

## 2022-04-20 NOTE — Progress Notes (Signed)
Assisted Dr. Rob Fitzgerald with right, pectoralis, ultrasound guided block. Side rails up, monitors on throughout procedure. See vital signs in flow sheet. Tolerated Procedure well. 

## 2022-04-20 NOTE — Interval H&P Note (Signed)
History and Physical Interval Note:no change in H and P ? ?04/20/2022 ?9:15 AM ? ?Joya Gaskins Debra Hobbs  has presented today for surgery, with the diagnosis of RIGHT BREAST CANCER.  The various methods of treatment have been discussed with the patient and family. After consideration of risks, benefits and other options for treatment, the patient has consented to  Procedure(s): ?RIGHT BREAST LUMPECTOMY WITH RADIOACTIVE SEED AND SENTINEL LYMPH NODE BIOPSY (Right) as a surgical intervention.  The patient's history has been reviewed, patient examined, no change in status, stable for surgery.  I have reviewed the patient's chart and labs.  Questions were answered to the patient's satisfaction.   ? ? ?Coralie Keens ? ? ?

## 2022-04-20 NOTE — Anesthesia Procedure Notes (Signed)
Procedure Name: LMA Insertion ?Date/Time: 04/20/2022 10:54 AM ?Performed by: Willa Frater, CRNA ?Pre-anesthesia Checklist: Patient identified, Emergency Drugs available, Suction available and Patient being monitored ?Patient Re-evaluated:Patient Re-evaluated prior to induction ?Oxygen Delivery Method: Circle system utilized ?Preoxygenation: Pre-oxygenation with 100% oxygen ?Induction Type: IV induction ?Ventilation: Mask ventilation without difficulty ?LMA: LMA inserted ?LMA Size: 3.0 ?Number of attempts: 1 ?Airway Equipment and Method: Bite block ?Placement Confirmation: positive ETCO2 ?Tube secured with: Tape ?Dental Injury: Teeth and Oropharynx as per pre-operative assessment  ? ? ? ? ?

## 2022-04-20 NOTE — Transfer of Care (Signed)
Immediate Anesthesia Transfer of Care Note ? ?Patient: Debra Hobbs ? ?Procedure(s) Performed: RIGHT BREAST LUMPECTOMY WITH RADIOACTIVE SEED AND SENTINEL LYMPH NODE BIOPSY (Right: Breast) ? ?Patient Location: PACU ? ?Anesthesia Type:GA combined with regional for post-op pain ? ?Level of Consciousness: awake, drowsy and patient cooperative ? ?Airway & Oxygen Therapy: Patient Spontanous Breathing and Patient connected to face mask oxygen ? ?Post-op Assessment: Report given to RN and Post -op Vital signs reviewed and stable ? ?Post vital signs: Reviewed and stable ? ?Last Vitals:  ?Vitals Value Taken Time  ?BP    ?Temp    ?Pulse    ?Resp    ?SpO2    ? ? ?Last Pain:  ?Vitals:  ? 04/20/22 0943  ?TempSrc: Oral  ?PainSc: 0-No pain  ?   ? ?Patients Stated Pain Goal: 3 (04/20/22 8346) ? ?Complications: No notable events documented. ?

## 2022-04-21 ENCOUNTER — Encounter (HOSPITAL_COMMUNITY): Payer: Self-pay

## 2022-04-21 ENCOUNTER — Encounter (HOSPITAL_BASED_OUTPATIENT_CLINIC_OR_DEPARTMENT_OTHER): Payer: Self-pay | Admitting: Surgery

## 2022-04-25 LAB — SURGICAL PATHOLOGY

## 2022-04-27 ENCOUNTER — Telehealth: Payer: Self-pay | Admitting: *Deleted

## 2022-04-27 ENCOUNTER — Encounter: Payer: Self-pay | Admitting: *Deleted

## 2022-04-27 NOTE — Telephone Encounter (Signed)
Received order for onoctype testing. Requisition faxed to pathology and [place]. ?

## 2022-05-02 ENCOUNTER — Telehealth: Payer: Self-pay | Admitting: *Deleted

## 2022-05-02 ENCOUNTER — Inpatient Hospital Stay: Payer: Self-pay | Attending: Hematology and Oncology | Admitting: Hematology and Oncology

## 2022-05-02 ENCOUNTER — Other Ambulatory Visit: Payer: Self-pay

## 2022-05-02 DIAGNOSIS — C50211 Malignant neoplasm of upper-inner quadrant of right female breast: Secondary | ICD-10-CM | POA: Insufficient documentation

## 2022-05-02 DIAGNOSIS — Z170 Estrogen receptor positive status [ER+]: Secondary | ICD-10-CM | POA: Insufficient documentation

## 2022-05-02 NOTE — Assessment & Plan Note (Addendum)
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 ?Pathology counseling: I discussed the final pathology report of the patient provided  a copy of this report. I discussed the margins as well as lymph node surgeries. We also discussed the final staging along with previously performed ER/PR and HER-2/neu testing. ? ?Treatment plan: ?1.  Mammaprint testing ?2. adjuvant radiation ?3.  Adjuvant antiestrogen therapy ? ?Mammaprint counseling: MINDACT is a prospective, randomized phase III controlled trial that investigates the clinical utility of MammaPrint, when compared to standard clinical pathological criteria, with 6,693 patients enrolled from over 111 institutions. ?Clinical high-risk patients with a Low Risk MammaPrint result, including 48% node-positive, had 5-year distant metastasis-free survival rate in excess of 94 percent, whether randomized to receive adjuvant chemotherapy or not proving MammaPrint?s ability to safely identify Low Risk patients. ? ? ?Return to clinic based upon MammaPrint test result. ? ? ? ?

## 2022-05-02 NOTE — Telephone Encounter (Signed)
Per Dr. Lindi Adie cancel Oncotype. Reached out to pathology and [place].  ?Mammaprint testing ordered, faxed to [facility] and pathology ?

## 2022-05-10 ENCOUNTER — Encounter: Payer: Self-pay | Admitting: *Deleted

## 2022-05-10 ENCOUNTER — Telehealth: Payer: Self-pay | Admitting: *Deleted

## 2022-05-10 ENCOUNTER — Telehealth: Payer: Self-pay | Admitting: Radiation Oncology

## 2022-05-10 DIAGNOSIS — Z170 Estrogen receptor positive status [ER+]: Secondary | ICD-10-CM

## 2022-05-10 NOTE — Telephone Encounter (Signed)
Receive Mammaprint of LOW RISK. Physician team notified. Pt notified of results and chemo not recommended via [place] interpreter. Informed next step is xrt with Dr. Isidore Moos and her office will call with appt.

## 2022-05-10 NOTE — Telephone Encounter (Signed)
Called patient to schedule a consultation w. Dr. Squire. No answer, LVM for a return call.  

## 2022-05-11 ENCOUNTER — Telehealth: Payer: Self-pay | Admitting: Radiation Oncology

## 2022-05-11 NOTE — Telephone Encounter (Signed)
Called patient to schedule a follow up appointment with Dr. Isidore Moos. No answer, LVM for a return call.

## 2022-05-12 ENCOUNTER — Telehealth: Payer: Self-pay | Admitting: Radiation Oncology

## 2022-05-12 ENCOUNTER — Encounter (HOSPITAL_COMMUNITY): Payer: Self-pay

## 2022-05-12 NOTE — Telephone Encounter (Signed)
Sent unable to contact letter 5/25.

## 2022-05-12 NOTE — Telephone Encounter (Signed)
Called patient to schedule a follow up appointment w. Dr. Squire. No answer, LVM for a return call. 

## 2022-05-17 ENCOUNTER — Encounter: Payer: Self-pay | Admitting: *Deleted

## 2022-05-17 NOTE — Progress Notes (Incomplete)
Location of Breast Cancer:  Malignant neoplasm of upper-inner quadrant of right breast in female, estrogen receptor positive  Histology per Pathology Report:  04/20/2022 FINAL MICROSCOPIC DIAGNOSIS:  A. BREAST, RIGHT, LUMPECTOMY:  - Invasive and in situ lobular carcinoma, 2.1 cm.  - Invasive carcinoma is 0.2 cm from superior margin and 0.4 cm from posterior margin.  - Carcinoma in situ is less than 0.1 cm from superior margin.  - Carcinoma extends into dermis of nipple.  - Biopsy site and biopsy clip.  - See oncology table.  B. LYMPH NODE, RIGHT AXILLARY, SENTINEL, EXCISION:  - Metastatic carcinoma in ne lymph node (1/1).  - Metastasis is 0.8 cm.  C. LYMPH NODE, RIGHT AXILLARY, SENTINEL, EXCISION:  - One lymph node negative for metastatic carcinoma (0/1).  Immunohistochemistry pending  D. LYMPH NODE, RIGHT AXILLARY, SENTINEL, EXCISION:  - One lymph node negative for metastatic carcinoma (0/1).  Immunohistochemistry pending  E. LYMPH NODE, RIGHT AXILLARY, SENTINEL, EXCISION:  - One lymph node negative for metastatic carcinoma (0/1).  Immunohistochemistry pending  F. LYMPH NODE, RIGHT AXILLARY, SENTINEL, EXCISION:  - One lymph node negative for metastatic carcinoma (0/1).  Immunohistochemistry pending  G. LYMPH NODE, RIGHT AXILLARY, SENTINEL, EXCISION:  - One lymph node negative for metastatic carcinoma (0/1).  Immunohistochemistry pending  H. LYMPH NODE, RIGHT AXILLARY, SENTINEL, EXCISION:  - One lymph node negative for metastatic carcinoma (0/1).  Immunohistochemistry pending  I. LYMPH NODE, RIGHT AXILLARY, SENTINEL, EXCISION:  - One lymph node negative for metastatic carcinoma (0/1).  Immunohistochemistry pending  J. LYMPH NODE, RIGHT AXILLARY, SENTINEL, EXCISION:  - One lymph node negative for metastatic carcinoma (0/1).  Immunohistochemistry pending  K. LYMPH NODE, RIGHT AXILLARY, SENTINEL, EXCISION:  - One lymph node negative for metastatic carcinoma (0/1).   Immunohistochemistry pending  L. LYMPH NODE, RIGHT AXILLARY, SENTINEL, EXCISION:  - One lymph node negative for metastatic carcinoma (0/1).  Immunohistochemistry pending   Receptor Status: ER(90%), PR (90%), Her2-neu (Negative via FISH), Ki-67(1%)  Did patient present with symptoms (if so, please note symptoms) or was this found on screening mammography?: Patient presented with progressive right nipple inversion over the past year. Diagnostic mammogram and [place] on 01/13/2022 showed a highly suspicious mass in the retroareolar right breast between 12 and 1 o'clock measuring at least 3.3 cm.  Past/Anticipated interventions by surgeon, if any:  05/13/2022 Dr. Coralie Keens (office visit) --Physical Exam  She looks well on exam. The breast incision is well-healed as well as the axillary incision without evidence of infection.  --Assessment and Plan She is actually doing very well status post central lumpectomy with removal of the nipple areolar complex and sentinel node biopsy for invasive cancer.  She will return to work in 1 week to full activity. She will continue her care at the cancer center. I will see her back in 6 months. Return in about 6 months (around 11/13/2022).  04/20/2022 --Dr. Coralie Keens RIGHT BREAST CENTRAL LUMPECTOMY WITH RADIOACTIVE SEED  DEEP RIGHT AXILLARY SENTINEL LYMPH NODE BIOPSY INJECTION OF MAGTRACE FOR LYMPH NODE MAPPING  Past/Anticipated interventions by medical oncology, if any:  Under care of Dr. Nicholas Lose 05/02/2022 --Treatment plan: Mammaprint testing 05/10/22 Dawn Stuart--Navigator: "Receive Mammaprint of LOW RISK. Physician team notified. Pt notified of results and chemo not recommended via [place] interpreter. Informed next step is xrt" Adjuvant radiation Adjuvant antiestrogen therapy --Return to clinic based upon MammaPrint test result.  Lymphedema issues, if any:  ***    Pain issues, if any:  ***  SAFETY ISSUES: Prior  radiation? *** Pacemaker/ICD? *** Possible current pregnancy?***No--LMP: Is the patient on methotrexate? ***  Current Complaints / other details:  ***       

## 2022-05-17 NOTE — Progress Notes (Signed)
Radiation Oncology         [phone] ________________________________  Name: Debra Hobbs MRN: [id number]  Date: 05/18/2022  DOB: [birth year]  Follow-Up Visit Note  Outpatient  CC: Pcp, No  Nicholas Lose, MD  Diagnosis:   No diagnosis found.  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     Since consultation date of 02/08/22, Debra Hobbs underwent genetic testing on 02/23/22 which revealed no clinically significant variants detected. However, several variants of unknown significance were detected from +RNAinsight testing, including: p.R203C in the MUTYH gene, p.R376Q in the NF2 gene, and p.L663F in the TSC2 gene.    Pertinent imaging performed in the interval includes a bilateral breast MRI on 02/12/22 which showed the 3.5 cm mass in the medial retroareolar region of the right breast consistent with the patient's known malignancy, and a non-mass enhancement extending from the anterior aspect of the mass to the nipple. The right nipple was also seen to be retracted medially. Otherwise, MRI showed no evidence of lymphadenopathy or abnormal findings in the left breast.   The patient opted to proceed with right breast lumpectomy with nodal biopsies and nipple areolar complex removal on 04/20/22 under the care of Dr. Ninfa Linden.  Pathology from the procedure revealed: tumor size of 2.1 cm; histology of invasive and in-situ lobular carcinoma involving the dermis of  the nipple; margin status to invasive disease of 0.2 cm from superior margin and 0.4 cm from posterior margin; margin status to in situ disease of less than 0.1 cm from superior margin; nodal status of 1/11 right axillary sentinel lymph node excisions positive for metastatic carcinoma;  ER status: 90% positive with moderate staining intensity; PR status 90% positive with strong staining intensity; Proliferation marker Ki67 at 1%; Her2 status negative; Grade 2.  MammaPrint testing collected from the final surgical sample revealed the patient as low-risk luminal type A.   During her most recent follow up visit with Dr. Lindi Adie on 05/02/22, the patient reported pain to her mid back, right chest, and right nipple, and numbness. In terms of further treatment options, the patient has agreed to proceed with adjuvant antiestrogen therapy. Debra Hobbs will return to Dr Lindi Adie following XRT to discuss antiestrogen treatment options further.   The patient was noted to be doing well from as post-op standpoint other than mild discomfort behind her arm during her most recent follow up with Dr. Ninfa Linden on 05/13/22.   Symptomatically, the patient reports: ***        ALLERGIES:  has No Known Allergies.  Meds: Current Outpatient Medications  Medication Sig Dispense Refill   oxyCODONE (OXY IR/ROXICODONE) 5 MG immediate release tablet Take 1 tablet (5 mg total) by mouth every 6 (six) hours as needed for moderate pain, severe pain or breakthrough pain. 25 tablet 0   No current facility-administered medications for this encounter.    Physical Findings:  vitals were not taken for this visit. .     General: Alert and  oriented, in no acute distress HEENT: Head is normocephalic. Extraocular movements are intact. Oropharynx is clear. Neck: Neck is supple, no palpable cervical or supraclavicular lymphadenopathy. Heart: Regular in rate and rhythm with no murmurs, rubs, or gallops. Chest: Clear to auscultation bilaterally,  with no rhonchi, wheezes, or rales. Abdomen: Soft, nontender, nondistended, with no rigidity or guarding. Extremities: No cyanosis or edema. Lymphatics: see Neck Exam Musculoskeletal: symmetric strength and muscle tone throughout. Neurologic: No obvious focalities. Speech is fluent.  Psychiatric: Judgment and insight are intact. Affect is appropriate. Breast exam reveals ***  Lab Findings: No results found for: WBC, HGB, HCT, MCV, PLT  $Re'@LASTCHEMISTRY'SKa$ @  Radiographic Findings: MM Breast Surgical Specimen  Result Date: 04/20/2022 CLINICAL DATA:  Post lumpectomy specimen radiograph EXAM: SPECIMEN RADIOGRAPH OF THE RIGHT BREAST COMPARISON:  None Available. FINDINGS: Status post excision of the right breast. The radioactive seed and biopsy marker clip are present and completely intact. IMPRESSION: Specimen radiograph of the right breast. Electronically Signed   By: Audie Pinto M.D.   On: 04/20/2022 11:25  MM RT RADIOACTIVE SEED LOC MAMMO GUIDE  Result Date: 04/19/2022 CLINICAL DATA:  46 year old female with newly diagnosed invasive mammary carcinoma of the right breast present in for seed localization. EXAM: MAMMOGRAPHIC GUIDED RADIOACTIVE SEED LOCALIZATION OF THE RIGHT BREAST COMPARISON:  None Available. FINDINGS: Patient presents for radioactive seed localization prior to . I met with the patient and we discussed the procedure of seed localization including benefits and alternatives. We discussed the high likelihood of a successful procedure. We discussed the risks of the procedure including infection, bleeding, tissue injury and further surgery. We discussed the low dose of radioactivity involved in the procedure. Informed, written consent was given. The usual time-out protocol was performed immediately prior to the procedure. Using mammographic guidance, sterile technique, 1% lidocaine and an I-125 radioactive seed, the ribbon biopsy marking clip was localized using a medial approach. The  follow-up mammogram images confirm the seed in the expected location and were marked for Dr. Ninfa Linden. Follow-up survey of the patient confirms presence of the radioactive seed. Order number of I-125 seed:  [id number]. Total activity: 0.251 mCi reference Date: March 09, 2022 The patient tolerated the procedure well and was released from the [place]. Debra Hobbs was given instructions regarding seed removal. IMPRESSION: Radioactive seed localization right breast. No apparent complications. Electronically Signed   By: Audie Pinto M.D.   On: 04/19/2022 14:25   Impression/Plan: We discussed adjuvant radiotherapy today.  I recommend *** in order to ***.  I reviewed the logistics, benefits, risks, and potential side effects of this treatment in detail. Risks may include but not necessary be limited to acute and late injury tissue in the radiation fields such as skin irritation (change in color/pigmentation, itching, dryness, pain, peeling). Debra Hobbs may experience fatigue. We also discussed possible risk of long term cosmetic changes or scar tissue. There is also a smaller risk for lung toxicity, ***cardiac toxicity, ***brachial plexopathy, ***lymphedema, ***musculoskeletal changes, ***rib fragility or ***induction of a second malignancy, ***late chronic non-healing soft tissue wound.    The patient asked good questions which I answered to her satisfaction. Debra Hobbs is enthusiastic about proceeding with treatment. A consent form has been *** signed and placed in her chart.  A total of *** medically necessary complex treatment devices will be fabricated and supervised by me: *** fields with MLCs for custom blocks to protect heart, and lungs;  and, a Vac-lok. MORE COMPLEX DEVICES MAY BE MADE IN DOSIMETRY FOR FIELD IN  FIELD BEAMS FOR DOSE HOMOGENEITY.  I have requested : 3D Simulation which is medically necessary to give adequate dose to at risk tissues while sparing lungs and heart.  I have requested a DVH of the following  structures: lungs, heart, *** lumpectomy cavity.    The patient will receive *** Gy in *** fractions to the *** with *** fields.  This will be *** followed by a boost.  On date of service, in total, I spent *** minutes on this encounter. Patient was seen in person.  _____________________________________   Eppie Gibson, MD  This document serves as a record of services personally performed by Eppie Gibson, MD. It was created on her behalf by Roney Mans, a trained medical scribe. The creation of this record is based on the scribe's personal observations and the provider's statements to them. This document has been checked and approved by the attending provider.

## 2022-05-18 ENCOUNTER — Other Ambulatory Visit: Payer: Self-pay

## 2022-05-18 ENCOUNTER — Ambulatory Visit
Admission: RE | Admit: 2022-05-18 | Discharge: 2022-05-18 | Disposition: A | Discharge status: Home / Self Care | Payer: No Typology Code available for payment source | Source: Ambulatory Visit | Attending: Radiation Oncology | Admitting: Radiation Oncology

## 2022-05-18 ENCOUNTER — Encounter: Payer: Self-pay | Admitting: Radiation Oncology

## 2022-05-18 ENCOUNTER — Ambulatory Visit
Admission: RE | Admit: 2022-05-18 | Discharge: 2022-05-18 | Disposition: A | Discharge status: Home / Self Care | Payer: Self-pay | Source: Ambulatory Visit | Attending: Radiation Oncology | Admitting: Radiation Oncology

## 2022-05-18 VITALS — BP 110/65 | HR 73 | Temp 98.2°F | Resp 18 | Wt 122.2 lb

## 2022-05-18 DIAGNOSIS — Z170 Estrogen receptor positive status [ER+]: Secondary | ICD-10-CM | POA: Insufficient documentation

## 2022-05-18 DIAGNOSIS — C50211 Malignant neoplasm of upper-inner quadrant of right female breast: Secondary | ICD-10-CM | POA: Insufficient documentation

## 2022-05-18 DIAGNOSIS — Z510 Encounter for antineoplastic radiation therapy: Secondary | ICD-10-CM | POA: Insufficient documentation

## 2022-05-18 DIAGNOSIS — R609 Edema, unspecified: Secondary | ICD-10-CM | POA: Insufficient documentation

## 2022-05-18 NOTE — Progress Notes (Signed)
Location of Breast Cancer:  Malignant neoplasm of upper-inner quadrant of right breast in female, estrogen receptor positive  Histology per Pathology Report:  04/20/2022 FINAL MICROSCOPIC DIAGNOSIS:  A. BREAST, RIGHT, LUMPECTOMY:  - Invasive and in situ lobular carcinoma, 2.1 cm.  - Invasive carcinoma is 0.2 cm from superior margin and 0.4 cm from posterior margin.  - Carcinoma in situ is less than 0.1 cm from superior margin.  - Carcinoma extends into dermis of nipple.  - Biopsy site and biopsy clip.  - See oncology table.  B. LYMPH NODE, RIGHT AXILLARY, SENTINEL, EXCISION:  - Metastatic carcinoma in ne lymph node (1/1).  - Metastasis is 0.8 cm.  C. LYMPH NODE, RIGHT AXILLARY, SENTINEL, EXCISION:  - One lymph node negative for metastatic carcinoma (0/1).  Immunohistochemistry pending  D. LYMPH NODE, RIGHT AXILLARY, SENTINEL, EXCISION:  - One lymph node negative for metastatic carcinoma (0/1).  Immunohistochemistry pending  E. LYMPH NODE, RIGHT AXILLARY, SENTINEL, EXCISION:  - One lymph node negative for metastatic carcinoma (0/1).  Immunohistochemistry pending  F. LYMPH NODE, RIGHT AXILLARY, SENTINEL, EXCISION:  - One lymph node negative for metastatic carcinoma (0/1).  Immunohistochemistry pending  G. LYMPH NODE, RIGHT AXILLARY, SENTINEL, EXCISION:  - One lymph node negative for metastatic carcinoma (0/1).  Immunohistochemistry pending  H. LYMPH NODE, RIGHT AXILLARY, SENTINEL, EXCISION:  - One lymph node negative for metastatic carcinoma (0/1).  Immunohistochemistry pending  I. LYMPH NODE, RIGHT AXILLARY, SENTINEL, EXCISION:  - One lymph node negative for metastatic carcinoma (0/1).  Immunohistochemistry pending  J. LYMPH NODE, RIGHT AXILLARY, SENTINEL, EXCISION:  - One lymph node negative for metastatic carcinoma (0/1).  Immunohistochemistry pending  K. LYMPH NODE, RIGHT AXILLARY, SENTINEL, EXCISION:  - One lymph node negative for metastatic carcinoma (0/1).   Immunohistochemistry pending  L. LYMPH NODE, RIGHT AXILLARY, SENTINEL, EXCISION:  - One lymph node negative for metastatic carcinoma (0/1).  Immunohistochemistry pending   Receptor Status: ER(90%), PR (90%), Her2-neu (Negative via FISH), Ki-67(1%)  Did patient present with symptoms (if so, please note symptoms) or was this found on screening mammography?: Patient presented with progressive right nipple inversion over the past year. Diagnostic mammogram and [place] on 01/13/2022 showed a highly suspicious mass in the retroareolar right breast between 12 and 1 o'clock measuring at least 3.3 cm.  Past/Anticipated interventions by surgeon, if any:  05/13/2022 Dr. Coralie Keens (office visit) --Physical Exam  She looks well on exam. The breast incision is well-healed as well as the axillary incision without evidence of infection.  --Assessment and Plan She is actually doing very well status post central lumpectomy with removal of the nipple areolar complex and sentinel node biopsy for invasive cancer.  She will return to work in 1 week to full activity. She will continue her care at the cancer center. I will see her back in 6 months. Return in about 6 months (around 11/13/2022).  04/20/2022 --Dr. Coralie Keens RIGHT BREAST CENTRAL LUMPECTOMY WITH RADIOACTIVE SEED  DEEP RIGHT AXILLARY SENTINEL LYMPH NODE BIOPSY INJECTION OF MAGTRACE FOR LYMPH NODE MAPPING  Past/Anticipated interventions by medical oncology, if any:  Under care of Dr. Nicholas Lose 05/02/2022 --Treatment plan: Mammaprint testing 05/10/22 Dawn Stuart--Navigator: "Receive Mammaprint of LOW RISK. Physician team notified. Pt notified of results and chemo not recommended via [place] interpreter. Informed next step is xrt" Adjuvant radiation Adjuvant antiestrogen therapy --Return to clinic based upon MammaPrint test result.  Lymphedema issues, if any:  States that she said that she has a  little lymphedema and  some  numbness in arm area.    Pain issues, if any:  States that she has some pain in her breast area.   SAFETY ISSUES: Prior radiation? no Pacemaker/ICD? no Possible current pregnancy?No--LMP: May 14 Is the patient on methotrexate? no  Current Complaints / other details:  no Vitals:   05/18/22 0940  BP: 110/65  Pulse: 73  Resp: 18  Temp: 98.2 F (36.8 C)  SpO2: 100%  Weight: 55.4 kg   Interper [id number]

## 2022-05-19 ENCOUNTER — Other Ambulatory Visit: Payer: Self-pay

## 2022-05-19 DIAGNOSIS — C50211 Malignant neoplasm of upper-inner quadrant of right female breast: Secondary | ICD-10-CM

## 2022-05-24 DIAGNOSIS — C50211 Malignant neoplasm of upper-inner quadrant of right female breast: Secondary | ICD-10-CM | POA: Insufficient documentation

## 2022-05-24 DIAGNOSIS — Z170 Estrogen receptor positive status [ER+]: Secondary | ICD-10-CM | POA: Insufficient documentation

## 2022-05-25 ENCOUNTER — Other Ambulatory Visit: Payer: Self-pay

## 2022-05-25 ENCOUNTER — Ambulatory Visit
Admission: RE | Admit: 2022-05-25 | Discharge: 2022-05-25 | Disposition: A | Discharge status: Home / Self Care | Payer: No Typology Code available for payment source | Source: Ambulatory Visit | Attending: Radiation Oncology | Admitting: Radiation Oncology

## 2022-05-25 ENCOUNTER — Ambulatory Visit: Payer: No Typology Code available for payment source

## 2022-05-25 DIAGNOSIS — Z170 Estrogen receptor positive status [ER+]: Secondary | ICD-10-CM

## 2022-05-25 DIAGNOSIS — K769 Liver disease, unspecified: Secondary | ICD-10-CM

## 2022-05-25 LAB — RAD ONC ARIA SESSION SUMMARY
Course Elapsed Days: 0
Plan Fractions Treated to Date: 1
Plan Fractions Treated to Date: 1
Plan Prescribed Dose Per Fraction: 2 Gy
Plan Prescribed Dose Per Fraction: 2 Gy
Plan Total Fractions Prescribed: 25
Plan Total Fractions Prescribed: 25
Plan Total Prescribed Dose: 50 Gy
Plan Total Prescribed Dose: 50 Gy
Reference Point Dosage Given to Date: 2 Gy
Reference Point Dosage Given to Date: 2 Gy
Reference Point Session Dosage Given: 2 Gy
Reference Point Session Dosage Given: 2 Gy
Session Number: 1

## 2022-05-25 LAB — PREGNANCY, URINE: Preg Test, Ur: NEGATIVE

## 2022-05-25 MED ORDER — ALRA NON-METALLIC DEODORANT (RAD-ONC)
1.0000 "application " | Freq: Once | TOPICAL | Status: AC
  Administered 2022-05-25: 1 via TOPICAL

## 2022-05-25 MED ORDER — RADIAPLEXRX EX GEL: Freq: Once | CUTANEOUS | Status: AC

## 2022-05-25 NOTE — Progress Notes (Signed)

## 2022-05-26 ENCOUNTER — Ambulatory Visit
Admission: RE | Admit: 2022-05-26 | Discharge: 2022-05-26 | Disposition: A | Discharge status: Home / Self Care | Payer: No Typology Code available for payment source | Source: Ambulatory Visit | Attending: Radiation Oncology | Admitting: Radiation Oncology

## 2022-05-26 ENCOUNTER — Telehealth: Payer: Self-pay | Admitting: *Deleted

## 2022-05-26 ENCOUNTER — Other Ambulatory Visit: Payer: Self-pay

## 2022-05-26 DIAGNOSIS — Z170 Estrogen receptor positive status [ER+]: Secondary | ICD-10-CM

## 2022-05-26 LAB — RAD ONC ARIA SESSION SUMMARY
Course Elapsed Days: 1
Plan Fractions Treated to Date: 2
Plan Fractions Treated to Date: 2
Plan Prescribed Dose Per Fraction: 2 Gy
Plan Prescribed Dose Per Fraction: 2 Gy
Plan Total Fractions Prescribed: 25
Plan Total Fractions Prescribed: 25
Plan Total Prescribed Dose: 50 Gy
Plan Total Prescribed Dose: 50 Gy
Reference Point Dosage Given to Date: 4 Gy
Reference Point Dosage Given to Date: 4 Gy
Reference Point Session Dosage Given: 2 Gy
Reference Point Session Dosage Given: 2 Gy
Session Number: 2

## 2022-05-26 LAB — BUN & CREATININE (CHCC)
BUN: 13 mg/dL (ref 6–20)
Creatinine: 0.57 mg/dL (ref 0.44–1.00)
GFR, Estimated: >60 mL/min (ref >60)

## 2022-05-26 LAB — TSH: TSH: 1.702 u[IU]/mL (ref 0.350–4.500)

## 2022-05-26 NOTE — Telephone Encounter (Signed)
CALLED JULIE TO HAVE HER INFORM THIS PATIENT OF CT FOR 06-02-22- ARRIVAL TIME- 2:45 PM @ [facility], PATIENT TO BE NPO- 4 HRS. PRIOR TO TEST, SPOKE WITH JULIE THE INTREP. AND SHE WILL INFORM THE PATIENT OF THIS SCAN.

## 2022-05-27 ENCOUNTER — Other Ambulatory Visit: Payer: Self-pay

## 2022-05-27 ENCOUNTER — Telehealth: Payer: Self-pay | Admitting: Hematology and Oncology

## 2022-05-27 ENCOUNTER — Encounter: Payer: Self-pay | Admitting: *Deleted

## 2022-05-27 ENCOUNTER — Ambulatory Visit
Admission: RE | Admit: 2022-05-27 | Discharge: 2022-05-27 | Disposition: A | Discharge status: Home / Self Care | Payer: No Typology Code available for payment source | Source: Ambulatory Visit | Attending: Radiation Oncology | Admitting: Radiation Oncology

## 2022-05-27 LAB — RAD ONC ARIA SESSION SUMMARY
Course Elapsed Days: 2
Plan Fractions Treated to Date: 3
Plan Fractions Treated to Date: 3
Plan Prescribed Dose Per Fraction: 2 Gy
Plan Prescribed Dose Per Fraction: 2 Gy
Plan Total Fractions Prescribed: 25
Plan Total Fractions Prescribed: 25
Plan Total Prescribed Dose: 50 Gy
Plan Total Prescribed Dose: 50 Gy
Reference Point Dosage Given to Date: 6 Gy
Reference Point Dosage Given to Date: 6 Gy
Reference Point Session Dosage Given: 2 Gy
Reference Point Session Dosage Given: 2 Gy
Session Number: 3

## 2022-05-27 NOTE — Telephone Encounter (Signed)
Scheduled appointment per 6/9 scheduling message. Left message. Called the patient using [employer] provided to employees by [facility]. [phone]

## 2022-05-30 ENCOUNTER — Other Ambulatory Visit: Payer: Self-pay

## 2022-05-30 ENCOUNTER — Ambulatory Visit
Admission: RE | Admit: 2022-05-30 | Discharge: 2022-05-30 | Disposition: A | Discharge status: Home / Self Care | Payer: No Typology Code available for payment source | Source: Ambulatory Visit | Attending: Radiation Oncology | Admitting: Radiation Oncology

## 2022-05-30 LAB — RAD ONC ARIA SESSION SUMMARY
Course Elapsed Days: 5
Plan Fractions Treated to Date: 4
Plan Fractions Treated to Date: 4
Plan Prescribed Dose Per Fraction: 2 Gy
Plan Prescribed Dose Per Fraction: 2 Gy
Plan Total Fractions Prescribed: 25
Plan Total Fractions Prescribed: 25
Plan Total Prescribed Dose: 50 Gy
Plan Total Prescribed Dose: 50 Gy
Reference Point Dosage Given to Date: 8 Gy
Reference Point Dosage Given to Date: 8 Gy
Reference Point Session Dosage Given: 2 Gy
Reference Point Session Dosage Given: 2 Gy
Session Number: 4

## 2022-05-31 ENCOUNTER — Ambulatory Visit
Admission: RE | Admit: 2022-05-31 | Discharge: 2022-05-31 | Disposition: A | Discharge status: Home / Self Care | Payer: No Typology Code available for payment source | Source: Ambulatory Visit | Attending: Radiation Oncology | Admitting: Radiation Oncology

## 2022-05-31 ENCOUNTER — Other Ambulatory Visit: Payer: Self-pay

## 2022-05-31 LAB — RAD ONC ARIA SESSION SUMMARY
Course Elapsed Days: 6
Plan Fractions Treated to Date: 5
Plan Fractions Treated to Date: 5
Plan Prescribed Dose Per Fraction: 2 Gy
Plan Prescribed Dose Per Fraction: 2 Gy
Plan Total Fractions Prescribed: 25
Plan Total Fractions Prescribed: 25
Plan Total Prescribed Dose: 50 Gy
Plan Total Prescribed Dose: 50 Gy
Reference Point Dosage Given to Date: 10 Gy
Reference Point Dosage Given to Date: 10 Gy
Reference Point Session Dosage Given: 2 Gy
Reference Point Session Dosage Given: 2 Gy
Session Number: 5

## 2022-06-01 ENCOUNTER — Other Ambulatory Visit: Payer: Self-pay

## 2022-06-01 ENCOUNTER — Ambulatory Visit
Admission: RE | Admit: 2022-06-01 | Discharge: 2022-06-01 | Disposition: A | Discharge status: Home / Self Care | Payer: No Typology Code available for payment source | Source: Ambulatory Visit | Attending: Radiation Oncology | Admitting: Radiation Oncology

## 2022-06-01 LAB — RAD ONC ARIA SESSION SUMMARY
Course Elapsed Days: 7
Plan Fractions Treated to Date: 6
Plan Fractions Treated to Date: 6
Plan Prescribed Dose Per Fraction: 2 Gy
Plan Prescribed Dose Per Fraction: 2 Gy
Plan Total Fractions Prescribed: 25
Plan Total Fractions Prescribed: 25
Plan Total Prescribed Dose: 50 Gy
Plan Total Prescribed Dose: 50 Gy
Reference Point Dosage Given to Date: 12 Gy
Reference Point Dosage Given to Date: 12 Gy
Reference Point Session Dosage Given: 2 Gy
Reference Point Session Dosage Given: 2 Gy
Session Number: 6

## 2022-06-02 ENCOUNTER — Ambulatory Visit
Admission: RE | Admit: 2022-06-02 | Discharge: 2022-06-02 | Disposition: A | Discharge status: Home / Self Care | Payer: No Typology Code available for payment source | Source: Ambulatory Visit | Attending: Radiation Oncology | Admitting: Radiation Oncology

## 2022-06-02 ENCOUNTER — Encounter: Payer: Self-pay | Admitting: Radiation Oncology

## 2022-06-02 ENCOUNTER — Ambulatory Visit (HOSPITAL_COMMUNITY)
Admission: RE | Admit: 2022-06-02 | Discharge: 2022-06-02 | Disposition: A | Discharge status: Home / Self Care | Payer: Self-pay | Source: Ambulatory Visit | Attending: Radiation Oncology | Admitting: Radiation Oncology

## 2022-06-02 ENCOUNTER — Other Ambulatory Visit: Payer: Self-pay

## 2022-06-02 DIAGNOSIS — K769 Liver disease, unspecified: Secondary | ICD-10-CM | POA: Insufficient documentation

## 2022-06-02 LAB — RAD ONC ARIA SESSION SUMMARY
Course Elapsed Days: 8
Plan Fractions Treated to Date: 7
Plan Fractions Treated to Date: 7
Plan Prescribed Dose Per Fraction: 2 Gy
Plan Prescribed Dose Per Fraction: 2 Gy
Plan Total Fractions Prescribed: 25
Plan Total Fractions Prescribed: 25
Plan Total Prescribed Dose: 50 Gy
Plan Total Prescribed Dose: 50 Gy
Reference Point Dosage Given to Date: 14 Gy
Reference Point Dosage Given to Date: 14 Gy
Reference Point Session Dosage Given: 2 Gy
Reference Point Session Dosage Given: 2 Gy
Session Number: 7

## 2022-06-02 MED ORDER — IOHEXOL 300 MG/ML  SOLN
100.0000 mL | Freq: Once | INTRAMUSCULAR | Status: AC | PRN
  Administered 2022-06-02: 100 mL via INTRAVENOUS

## 2022-06-02 MED ORDER — SODIUM CHLORIDE (PF) 0.9 % IJ SOLN
INTRAMUSCULAR | Status: AC
  Filled 2022-06-02: qty 50

## 2022-06-02 NOTE — Progress Notes (Signed)
Received call from [place] interpreter checking on status of financial assistance application for patient.I was unable to locate billing notes.  Emailed [employer] and customer service and provided as much information as I was given and Julie's contact number to follow up.

## 2022-06-03 ENCOUNTER — Other Ambulatory Visit: Payer: Self-pay

## 2022-06-03 ENCOUNTER — Ambulatory Visit
Admission: RE | Admit: 2022-06-03 | Discharge: 2022-06-03 | Disposition: A | Discharge status: Home / Self Care | Payer: No Typology Code available for payment source | Source: Ambulatory Visit | Attending: Radiation Oncology | Admitting: Radiation Oncology

## 2022-06-03 LAB — RAD ONC ARIA SESSION SUMMARY
Course Elapsed Days: 9
Plan Fractions Treated to Date: 8
Plan Fractions Treated to Date: 8
Plan Prescribed Dose Per Fraction: 2 Gy
Plan Prescribed Dose Per Fraction: 2 Gy
Plan Total Fractions Prescribed: 25
Plan Total Fractions Prescribed: 25
Plan Total Prescribed Dose: 50 Gy
Plan Total Prescribed Dose: 50 Gy
Reference Point Dosage Given to Date: 16 Gy
Reference Point Dosage Given to Date: 16 Gy
Reference Point Session Dosage Given: 2 Gy
Reference Point Session Dosage Given: 2 Gy
Session Number: 8

## 2022-06-06 ENCOUNTER — Ambulatory Visit: Payer: No Typology Code available for payment source

## 2022-06-06 ENCOUNTER — Other Ambulatory Visit: Payer: Self-pay

## 2022-06-06 ENCOUNTER — Ambulatory Visit
Admission: RE | Admit: 2022-06-06 | Discharge: 2022-06-06 | Disposition: A | Discharge status: Home / Self Care | Payer: No Typology Code available for payment source | Source: Ambulatory Visit | Attending: Radiation Oncology | Admitting: Radiation Oncology

## 2022-06-06 LAB — RAD ONC ARIA SESSION SUMMARY
Course Elapsed Days: 12
Plan Fractions Treated to Date: 9
Plan Fractions Treated to Date: 9
Plan Prescribed Dose Per Fraction: 2 Gy
Plan Prescribed Dose Per Fraction: 2 Gy
Plan Total Fractions Prescribed: 25
Plan Total Fractions Prescribed: 25
Plan Total Prescribed Dose: 50 Gy
Plan Total Prescribed Dose: 50 Gy
Reference Point Dosage Given to Date: 18 Gy
Reference Point Dosage Given to Date: 18 Gy
Reference Point Session Dosage Given: 2 Gy
Reference Point Session Dosage Given: 2 Gy
Session Number: 9

## 2022-06-07 ENCOUNTER — Other Ambulatory Visit: Payer: Self-pay

## 2022-06-07 ENCOUNTER — Encounter: Payer: Self-pay | Admitting: *Deleted

## 2022-06-07 ENCOUNTER — Ambulatory Visit
Admission: RE | Admit: 2022-06-07 | Discharge: 2022-06-07 | Disposition: A | Discharge status: Home / Self Care | Payer: No Typology Code available for payment source | Source: Ambulatory Visit | Attending: Radiation Oncology | Admitting: Radiation Oncology

## 2022-06-07 LAB — RAD ONC ARIA SESSION SUMMARY
Course Elapsed Days: 13
Plan Fractions Treated to Date: 10
Plan Fractions Treated to Date: 10
Plan Prescribed Dose Per Fraction: 2 Gy
Plan Prescribed Dose Per Fraction: 2 Gy
Plan Total Fractions Prescribed: 25
Plan Total Fractions Prescribed: 25
Plan Total Prescribed Dose: 50 Gy
Plan Total Prescribed Dose: 50 Gy
Reference Point Dosage Given to Date: 20 Gy
Reference Point Dosage Given to Date: 20 Gy
Reference Point Session Dosage Given: 2 Gy
Reference Point Session Dosage Given: 2 Gy
Session Number: 10

## 2022-06-08 ENCOUNTER — Other Ambulatory Visit: Payer: Self-pay

## 2022-06-08 ENCOUNTER — Ambulatory Visit
Admission: RE | Admit: 2022-06-08 | Discharge: 2022-06-08 | Disposition: A | Discharge status: Home / Self Care | Payer: No Typology Code available for payment source | Source: Ambulatory Visit | Attending: Radiation Oncology | Admitting: Radiation Oncology

## 2022-06-08 ENCOUNTER — Ambulatory Visit: Payer: No Typology Code available for payment source | Admitting: Radiation Oncology

## 2022-06-08 LAB — RAD ONC ARIA SESSION SUMMARY
Course Elapsed Days: 14
Plan Fractions Treated to Date: 11
Plan Fractions Treated to Date: 11
Plan Prescribed Dose Per Fraction: 2 Gy
Plan Prescribed Dose Per Fraction: 2 Gy
Plan Total Fractions Prescribed: 25
Plan Total Fractions Prescribed: 25
Plan Total Prescribed Dose: 50 Gy
Plan Total Prescribed Dose: 50 Gy
Reference Point Dosage Given to Date: 22 Gy
Reference Point Dosage Given to Date: 22 Gy
Reference Point Session Dosage Given: 2 Gy
Reference Point Session Dosage Given: 2 Gy
Session Number: 11

## 2022-06-09 ENCOUNTER — Other Ambulatory Visit: Payer: Self-pay

## 2022-06-09 ENCOUNTER — Ambulatory Visit
Admission: RE | Admit: 2022-06-09 | Discharge: 2022-06-09 | Disposition: A | Discharge status: Home / Self Care | Payer: No Typology Code available for payment source | Source: Ambulatory Visit | Attending: Radiation Oncology | Admitting: Radiation Oncology

## 2022-06-09 LAB — RAD ONC ARIA SESSION SUMMARY
Course Elapsed Days: 15
Plan Fractions Treated to Date: 12
Plan Fractions Treated to Date: 12
Plan Prescribed Dose Per Fraction: 2 Gy
Plan Prescribed Dose Per Fraction: 2 Gy
Plan Total Fractions Prescribed: 25
Plan Total Fractions Prescribed: 25
Plan Total Prescribed Dose: 50 Gy
Plan Total Prescribed Dose: 50 Gy
Reference Point Dosage Given to Date: 24 Gy
Reference Point Dosage Given to Date: 24 Gy
Reference Point Session Dosage Given: 2 Gy
Reference Point Session Dosage Given: 2 Gy
Session Number: 12

## 2022-06-10 ENCOUNTER — Other Ambulatory Visit: Payer: Self-pay

## 2022-06-10 ENCOUNTER — Ambulatory Visit
Admission: RE | Admit: 2022-06-10 | Discharge: 2022-06-10 | Disposition: A | Discharge status: Home / Self Care | Payer: No Typology Code available for payment source | Source: Ambulatory Visit | Attending: Radiation Oncology | Admitting: Radiation Oncology

## 2022-06-10 LAB — RAD ONC ARIA SESSION SUMMARY
Course Elapsed Days: 16
Plan Fractions Treated to Date: 13
Plan Fractions Treated to Date: 13
Plan Prescribed Dose Per Fraction: 2 Gy
Plan Prescribed Dose Per Fraction: 2 Gy
Plan Total Fractions Prescribed: 25
Plan Total Fractions Prescribed: 25
Plan Total Prescribed Dose: 50 Gy
Plan Total Prescribed Dose: 50 Gy
Reference Point Dosage Given to Date: 26 Gy
Reference Point Dosage Given to Date: 26 Gy
Reference Point Session Dosage Given: 2 Gy
Reference Point Session Dosage Given: 2 Gy
Session Number: 13

## 2022-06-13 ENCOUNTER — Ambulatory Visit
Admission: RE | Admit: 2022-06-13 | Discharge: 2022-06-13 | Disposition: A | Discharge status: Home / Self Care | Payer: No Typology Code available for payment source | Source: Ambulatory Visit | Attending: Radiation Oncology | Admitting: Radiation Oncology

## 2022-06-13 ENCOUNTER — Other Ambulatory Visit: Payer: Self-pay

## 2022-06-13 DIAGNOSIS — Z170 Estrogen receptor positive status [ER+]: Secondary | ICD-10-CM

## 2022-06-13 LAB — RAD ONC ARIA SESSION SUMMARY
Course Elapsed Days: 19
Plan Fractions Treated to Date: 14
Plan Fractions Treated to Date: 14
Plan Prescribed Dose Per Fraction: 2 Gy
Plan Prescribed Dose Per Fraction: 2 Gy
Plan Total Fractions Prescribed: 25
Plan Total Fractions Prescribed: 25
Plan Total Prescribed Dose: 50 Gy
Plan Total Prescribed Dose: 50 Gy
Reference Point Dosage Given to Date: 28 Gy
Reference Point Dosage Given to Date: 28 Gy
Reference Point Session Dosage Given: 2 Gy
Reference Point Session Dosage Given: 2 Gy
Session Number: 14

## 2022-06-14 ENCOUNTER — Ambulatory Visit
Admission: RE | Admit: 2022-06-14 | Discharge: 2022-06-14 | Disposition: A | Discharge status: Home / Self Care | Payer: No Typology Code available for payment source | Source: Ambulatory Visit | Attending: Radiation Oncology | Admitting: Radiation Oncology

## 2022-06-14 ENCOUNTER — Other Ambulatory Visit: Payer: Self-pay

## 2022-06-14 LAB — RAD ONC ARIA SESSION SUMMARY
Course Elapsed Days: 20
Plan Fractions Treated to Date: 15
Plan Fractions Treated to Date: 15
Plan Prescribed Dose Per Fraction: 2 Gy
Plan Prescribed Dose Per Fraction: 2 Gy
Plan Total Fractions Prescribed: 25
Plan Total Fractions Prescribed: 25
Plan Total Prescribed Dose: 50 Gy
Plan Total Prescribed Dose: 50 Gy
Reference Point Dosage Given to Date: 30 Gy
Reference Point Dosage Given to Date: 30 Gy
Reference Point Session Dosage Given: 2 Gy
Reference Point Session Dosage Given: 2 Gy
Session Number: 15

## 2022-06-15 ENCOUNTER — Other Ambulatory Visit: Payer: Self-pay

## 2022-06-15 ENCOUNTER — Ambulatory Visit
Admission: RE | Admit: 2022-06-15 | Discharge: 2022-06-15 | Disposition: A | Discharge status: Home / Self Care | Payer: No Typology Code available for payment source | Source: Ambulatory Visit | Attending: Radiation Oncology | Admitting: Radiation Oncology

## 2022-06-15 LAB — RAD ONC ARIA SESSION SUMMARY
Course Elapsed Days: 21
Plan Fractions Treated to Date: 16
Plan Fractions Treated to Date: 16
Plan Prescribed Dose Per Fraction: 2 Gy
Plan Prescribed Dose Per Fraction: 2 Gy
Plan Total Fractions Prescribed: 25
Plan Total Fractions Prescribed: 25
Plan Total Prescribed Dose: 50 Gy
Plan Total Prescribed Dose: 50 Gy
Reference Point Dosage Given to Date: 32 Gy
Reference Point Dosage Given to Date: 32 Gy
Reference Point Session Dosage Given: 2 Gy
Reference Point Session Dosage Given: 2 Gy
Session Number: 16

## 2022-06-16 ENCOUNTER — Ambulatory Visit
Admission: RE | Admit: 2022-06-16 | Discharge: 2022-06-16 | Disposition: A | Discharge status: Home / Self Care | Payer: No Typology Code available for payment source | Source: Ambulatory Visit | Attending: Radiation Oncology | Admitting: Radiation Oncology

## 2022-06-16 ENCOUNTER — Other Ambulatory Visit: Payer: No Typology Code available for payment source

## 2022-06-16 ENCOUNTER — Other Ambulatory Visit: Payer: Self-pay

## 2022-06-16 LAB — RAD ONC ARIA SESSION SUMMARY
Course Elapsed Days: 22
Plan Fractions Treated to Date: 17
Plan Fractions Treated to Date: 17
Plan Prescribed Dose Per Fraction: 2 Gy
Plan Prescribed Dose Per Fraction: 2 Gy
Plan Total Fractions Prescribed: 25
Plan Total Fractions Prescribed: 25
Plan Total Prescribed Dose: 50 Gy
Plan Total Prescribed Dose: 50 Gy
Reference Point Dosage Given to Date: 34 Gy
Reference Point Dosage Given to Date: 34 Gy
Reference Point Session Dosage Given: 2 Gy
Reference Point Session Dosage Given: 2 Gy
Session Number: 17

## 2022-06-17 ENCOUNTER — Ambulatory Visit
Admission: RE | Admit: 2022-06-17 | Discharge: 2022-06-17 | Disposition: A | Discharge status: Home / Self Care | Payer: No Typology Code available for payment source | Source: Ambulatory Visit | Attending: Radiation Oncology | Admitting: Radiation Oncology

## 2022-06-17 ENCOUNTER — Other Ambulatory Visit: Payer: Self-pay

## 2022-06-17 LAB — RAD ONC ARIA SESSION SUMMARY
Course Elapsed Days: 23
Plan Fractions Treated to Date: 18
Plan Fractions Treated to Date: 18
Plan Prescribed Dose Per Fraction: 2 Gy
Plan Prescribed Dose Per Fraction: 2 Gy
Plan Total Fractions Prescribed: 25
Plan Total Fractions Prescribed: 25
Plan Total Prescribed Dose: 50 Gy
Plan Total Prescribed Dose: 50 Gy
Reference Point Dosage Given to Date: 36 Gy
Reference Point Dosage Given to Date: 36 Gy
Reference Point Session Dosage Given: 2 Gy
Reference Point Session Dosage Given: 2 Gy
Session Number: 18

## 2022-06-17 NOTE — Progress Notes (Unsigned)
[place] Work  Clinical Social Work was referred by Art therapist for assessment of psychosocial needs.  Clinical Social Worker met with patient  to offer support and assess for needs.  Spanish interpreter was also present.  Per referral request, provided patient with a [employer],  Also completed [place] Program Application.  Patient stated there are three individuals in her household.  She is unemployed and requested assistance with her bills.  She expressed no other concerns or needs.  CSW provided contact information and encouraged her to call with any future needs.  She expressed understanding.  Claire Shown has also met with her regarding the [employer].   Margaree Mackintosh, LCSW  Clinical Social Worker [facility]

## 2022-06-20 ENCOUNTER — Ambulatory Visit
Admission: RE | Admit: 2022-06-20 | Discharge: 2022-06-20 | Disposition: A | Discharge status: Home / Self Care | Payer: No Typology Code available for payment source | Source: Ambulatory Visit | Attending: Radiation Oncology | Admitting: Radiation Oncology

## 2022-06-20 ENCOUNTER — Other Ambulatory Visit: Payer: Self-pay

## 2022-06-20 ENCOUNTER — Ambulatory Visit: Payer: No Typology Code available for payment source | Admitting: Radiation Oncology

## 2022-06-20 DIAGNOSIS — C50211 Malignant neoplasm of upper-inner quadrant of right female breast: Secondary | ICD-10-CM | POA: Insufficient documentation

## 2022-06-20 DIAGNOSIS — Z170 Estrogen receptor positive status [ER+]: Secondary | ICD-10-CM

## 2022-06-20 LAB — RAD ONC ARIA SESSION SUMMARY
Course Elapsed Days: 26
Plan Fractions Treated to Date: 19
Plan Fractions Treated to Date: 19
Plan Prescribed Dose Per Fraction: 2 Gy
Plan Prescribed Dose Per Fraction: 2 Gy
Plan Total Fractions Prescribed: 25
Plan Total Fractions Prescribed: 25
Plan Total Prescribed Dose: 50 Gy
Plan Total Prescribed Dose: 50 Gy
Reference Point Dosage Given to Date: 38 Gy
Reference Point Dosage Given to Date: 38 Gy
Reference Point Session Dosage Given: 2 Gy
Reference Point Session Dosage Given: 2 Gy
Session Number: 19

## 2022-06-20 MED ORDER — RADIAPLEXRX EX GEL: Freq: Once | CUTANEOUS | Status: AC

## 2022-06-21 ENCOUNTER — Ambulatory Visit: Payer: No Typology Code available for payment source | Admitting: Radiation Oncology

## 2022-06-22 ENCOUNTER — Other Ambulatory Visit: Payer: Self-pay

## 2022-06-22 ENCOUNTER — Ambulatory Visit
Admission: RE | Admit: 2022-06-22 | Discharge: 2022-06-22 | Disposition: A | Discharge status: Home / Self Care | Payer: No Typology Code available for payment source | Source: Ambulatory Visit | Attending: Radiation Oncology | Admitting: Radiation Oncology

## 2022-06-22 LAB — RAD ONC ARIA SESSION SUMMARY
Course Elapsed Days: 28
Plan Fractions Treated to Date: 20
Plan Fractions Treated to Date: 20
Plan Prescribed Dose Per Fraction: 2 Gy
Plan Prescribed Dose Per Fraction: 2 Gy
Plan Total Fractions Prescribed: 25
Plan Total Fractions Prescribed: 25
Plan Total Prescribed Dose: 50 Gy
Plan Total Prescribed Dose: 50 Gy
Reference Point Dosage Given to Date: 40 Gy
Reference Point Dosage Given to Date: 40 Gy
Reference Point Session Dosage Given: 2 Gy
Reference Point Session Dosage Given: 2 Gy
Session Number: 20

## 2022-06-23 ENCOUNTER — Other Ambulatory Visit: Payer: Self-pay

## 2022-06-23 ENCOUNTER — Ambulatory Visit
Admission: RE | Admit: 2022-06-23 | Discharge: 2022-06-23 | Disposition: A | Discharge status: Home / Self Care | Payer: No Typology Code available for payment source | Source: Ambulatory Visit | Attending: Radiation Oncology | Admitting: Radiation Oncology

## 2022-06-23 LAB — RAD ONC ARIA SESSION SUMMARY
Course Elapsed Days: 29
Plan Fractions Treated to Date: 21
Plan Fractions Treated to Date: 21
Plan Prescribed Dose Per Fraction: 2 Gy
Plan Prescribed Dose Per Fraction: 2 Gy
Plan Total Fractions Prescribed: 25
Plan Total Fractions Prescribed: 25
Plan Total Prescribed Dose: 50 Gy
Plan Total Prescribed Dose: 50 Gy
Reference Point Dosage Given to Date: 42 Gy
Reference Point Dosage Given to Date: 42 Gy
Reference Point Session Dosage Given: 2 Gy
Reference Point Session Dosage Given: 2 Gy
Session Number: 21

## 2022-06-24 ENCOUNTER — Ambulatory Visit
Admission: RE | Admit: 2022-06-24 | Discharge: 2022-06-24 | Disposition: A | Discharge status: Home / Self Care | Payer: No Typology Code available for payment source | Source: Ambulatory Visit | Attending: Radiation Oncology | Admitting: Radiation Oncology

## 2022-06-24 ENCOUNTER — Other Ambulatory Visit: Payer: Self-pay

## 2022-06-24 LAB — RAD ONC ARIA SESSION SUMMARY
Course Elapsed Days: 30
Plan Fractions Treated to Date: 22
Plan Fractions Treated to Date: 22
Plan Prescribed Dose Per Fraction: 2 Gy
Plan Prescribed Dose Per Fraction: 2 Gy
Plan Total Fractions Prescribed: 25
Plan Total Fractions Prescribed: 25
Plan Total Prescribed Dose: 50 Gy
Plan Total Prescribed Dose: 50 Gy
Reference Point Dosage Given to Date: 44 Gy
Reference Point Dosage Given to Date: 44 Gy
Reference Point Session Dosage Given: 2 Gy
Reference Point Session Dosage Given: 2 Gy
Session Number: 22

## 2022-06-27 ENCOUNTER — Ambulatory Visit: Payer: No Typology Code available for payment source | Admitting: Radiation Oncology

## 2022-06-27 ENCOUNTER — Other Ambulatory Visit: Payer: Self-pay

## 2022-06-27 ENCOUNTER — Ambulatory Visit
Admission: RE | Admit: 2022-06-27 | Discharge: 2022-06-27 | Disposition: A | Discharge status: Home / Self Care | Payer: No Typology Code available for payment source | Source: Ambulatory Visit | Attending: Radiation Oncology | Admitting: Radiation Oncology

## 2022-06-27 LAB — RAD ONC ARIA SESSION SUMMARY
Course Elapsed Days: 33
Plan Fractions Treated to Date: 23
Plan Fractions Treated to Date: 23
Plan Prescribed Dose Per Fraction: 2 Gy
Plan Prescribed Dose Per Fraction: 2 Gy
Plan Total Fractions Prescribed: 25
Plan Total Fractions Prescribed: 25
Plan Total Prescribed Dose: 50 Gy
Plan Total Prescribed Dose: 50 Gy
Reference Point Dosage Given to Date: 46 Gy
Reference Point Dosage Given to Date: 46 Gy
Reference Point Session Dosage Given: 2 Gy
Reference Point Session Dosage Given: 2 Gy
Session Number: 23

## 2022-06-28 ENCOUNTER — Ambulatory Visit
Admission: RE | Admit: 2022-06-28 | Discharge: 2022-06-28 | Disposition: A | Discharge status: Home / Self Care | Payer: No Typology Code available for payment source | Source: Ambulatory Visit | Attending: Radiation Oncology | Admitting: Radiation Oncology

## 2022-06-28 ENCOUNTER — Other Ambulatory Visit: Payer: Self-pay

## 2022-06-28 LAB — RAD ONC ARIA SESSION SUMMARY
Course Elapsed Days: 34
Plan Fractions Treated to Date: 24
Plan Fractions Treated to Date: 24
Plan Prescribed Dose Per Fraction: 2 Gy
Plan Prescribed Dose Per Fraction: 2 Gy
Plan Total Fractions Prescribed: 25
Plan Total Fractions Prescribed: 25
Plan Total Prescribed Dose: 50 Gy
Plan Total Prescribed Dose: 50 Gy
Reference Point Dosage Given to Date: 48 Gy
Reference Point Dosage Given to Date: 48 Gy
Reference Point Session Dosage Given: 2 Gy
Reference Point Session Dosage Given: 2 Gy
Session Number: 24

## 2022-06-29 ENCOUNTER — Ambulatory Visit
Admission: RE | Admit: 2022-06-29 | Discharge: 2022-06-29 | Disposition: A | Discharge status: Home / Self Care | Payer: No Typology Code available for payment source | Source: Ambulatory Visit | Attending: Radiation Oncology | Admitting: Radiation Oncology

## 2022-06-29 ENCOUNTER — Other Ambulatory Visit: Payer: Self-pay

## 2022-06-29 LAB — RAD ONC ARIA SESSION SUMMARY
Course Elapsed Days: 35
Plan Fractions Treated to Date: 25
Plan Fractions Treated to Date: 25
Plan Prescribed Dose Per Fraction: 2 Gy
Plan Prescribed Dose Per Fraction: 2 Gy
Plan Total Fractions Prescribed: 25
Plan Total Fractions Prescribed: 25
Plan Total Prescribed Dose: 50 Gy
Plan Total Prescribed Dose: 50 Gy
Reference Point Dosage Given to Date: 50 Gy
Reference Point Dosage Given to Date: 50 Gy
Reference Point Session Dosage Given: 2 Gy
Reference Point Session Dosage Given: 2 Gy
Session Number: 25

## 2022-06-30 ENCOUNTER — Ambulatory Visit
Admission: RE | Admit: 2022-06-30 | Discharge: 2022-06-30 | Disposition: A | Discharge status: Home / Self Care | Payer: No Typology Code available for payment source | Source: Ambulatory Visit | Attending: Radiation Oncology | Admitting: Radiation Oncology

## 2022-06-30 ENCOUNTER — Other Ambulatory Visit: Payer: Self-pay

## 2022-06-30 LAB — RAD ONC ARIA SESSION SUMMARY
Course Elapsed Days: 36
Plan Fractions Treated to Date: 1
Plan Prescribed Dose Per Fraction: 2 Gy
Plan Total Fractions Prescribed: 5
Plan Total Prescribed Dose: 10 Gy
Reference Point Dosage Given to Date: 52 Gy
Reference Point Session Dosage Given: 2 Gy
Session Number: 26

## 2022-07-01 ENCOUNTER — Other Ambulatory Visit: Payer: Self-pay

## 2022-07-01 ENCOUNTER — Ambulatory Visit
Admission: RE | Admit: 2022-07-01 | Discharge: 2022-07-01 | Disposition: A | Discharge status: Home / Self Care | Payer: No Typology Code available for payment source | Source: Ambulatory Visit | Attending: Radiation Oncology | Admitting: Radiation Oncology

## 2022-07-01 LAB — RAD ONC ARIA SESSION SUMMARY
Course Elapsed Days: 37
Plan Fractions Treated to Date: 2
Plan Prescribed Dose Per Fraction: 2 Gy
Plan Total Fractions Prescribed: 5
Plan Total Prescribed Dose: 10 Gy
Reference Point Dosage Given to Date: 54 Gy
Reference Point Session Dosage Given: 2 Gy
Session Number: 27

## 2022-07-04 ENCOUNTER — Ambulatory Visit: Payer: No Typology Code available for payment source

## 2022-07-04 ENCOUNTER — Other Ambulatory Visit: Payer: Self-pay

## 2022-07-04 ENCOUNTER — Ambulatory Visit
Admission: RE | Admit: 2022-07-04 | Discharge: 2022-07-04 | Disposition: A | Discharge status: Home / Self Care | Payer: No Typology Code available for payment source | Source: Ambulatory Visit | Attending: Radiation Oncology | Admitting: Radiation Oncology

## 2022-07-04 DIAGNOSIS — Z170 Estrogen receptor positive status [ER+]: Secondary | ICD-10-CM

## 2022-07-04 LAB — RAD ONC ARIA SESSION SUMMARY
Course Elapsed Days: 40
Plan Fractions Treated to Date: 3
Plan Prescribed Dose Per Fraction: 2 Gy
Plan Total Fractions Prescribed: 5
Plan Total Prescribed Dose: 10 Gy
Reference Point Dosage Given to Date: 56 Gy
Reference Point Session Dosage Given: 2 Gy
Session Number: 28

## 2022-07-04 MED ORDER — RADIAPLEXRX EX GEL: Freq: Once | CUTANEOUS | Status: AC

## 2022-07-05 ENCOUNTER — Other Ambulatory Visit: Payer: Self-pay

## 2022-07-05 ENCOUNTER — Ambulatory Visit
Admission: RE | Admit: 2022-07-05 | Discharge: 2022-07-05 | Disposition: A | Discharge status: Home / Self Care | Payer: No Typology Code available for payment source | Source: Ambulatory Visit | Attending: Radiation Oncology | Admitting: Radiation Oncology

## 2022-07-05 ENCOUNTER — Ambulatory Visit: Payer: No Typology Code available for payment source | Attending: Radiation Oncology | Admitting: Physical Therapy

## 2022-07-05 DIAGNOSIS — M25512 Pain in left shoulder: Secondary | ICD-10-CM | POA: Insufficient documentation

## 2022-07-05 DIAGNOSIS — C50211 Malignant neoplasm of upper-inner quadrant of right female breast: Secondary | ICD-10-CM | POA: Insufficient documentation

## 2022-07-05 DIAGNOSIS — M542 Cervicalgia: Secondary | ICD-10-CM | POA: Insufficient documentation

## 2022-07-05 DIAGNOSIS — Z483 Aftercare following surgery for neoplasm: Secondary | ICD-10-CM | POA: Insufficient documentation

## 2022-07-05 DIAGNOSIS — Z170 Estrogen receptor positive status [ER+]: Secondary | ICD-10-CM | POA: Insufficient documentation

## 2022-07-05 DIAGNOSIS — R293 Abnormal posture: Secondary | ICD-10-CM | POA: Insufficient documentation

## 2022-07-05 LAB — RAD ONC ARIA SESSION SUMMARY
Course Elapsed Days: 41
Plan Fractions Treated to Date: 4
Plan Prescribed Dose Per Fraction: 2 Gy
Plan Total Fractions Prescribed: 5
Plan Total Prescribed Dose: 10 Gy
Reference Point Dosage Given to Date: 58 Gy
Reference Point Session Dosage Given: 2 Gy
Session Number: 29

## 2022-07-05 NOTE — Therapy (Signed)
OUTPATIENT PHYSICAL THERAPY BREAST CANCER BASELINE EVALUATION   Patient Name: Debra Hobbs MRN: [id number] DOB:25-Jul-1976, 46 y.o., female Today's Date: 07/05/2022   PT End of Session - 07/05/22 1239     Visit Number 2    Number of Visits 2    PT Start Time 1100    PT Stop Time 1200    PT Time Calculation (min) 60 min    Activity Tolerance Patient tolerated treatment well    Behavior During Therapy [facility] for tasks assessed/performed              Past Medical History:  Diagnosis Date   Cancer ([place])    Breast   Family history of uterine cancer    Past Surgical History:  Procedure Laterality Date   BREAST BIOPSY Right 01/20/2022   BREAST LUMPECTOMY WITH RADIOACTIVE SEED AND SENTINEL LYMPH NODE BIOPSY Right 04/20/2022   Procedure: RIGHT BREAST LUMPECTOMY WITH RADIOACTIVE SEED AND SENTINEL LYMPH NODE BIOPSY;  Surgeon: Coralie Keens, MD;  Location: [place];  Service: General;  Laterality: Right;   Patient Active Problem List   Diagnosis Date Noted   Genetic testing 03/03/2022   Family history of uterine cancer 02/23/2022   Malignant neoplasm of upper-inner quadrant of right breast in female, estrogen receptor positive ([place]) 01/26/2022    PCP: Pcp, No  REFERRING PROVIDER: Eppie Gibson, MD  REFERRING DIAG: C50.211,Z17.0 (ICD-10-CM) - Malignant neoplasm of upper-inner quadrant of right breast in female, estrogen receptor positive ([place])   THERAPY DIAG:  Abnormal posture - Plan: PT plan of care cert/re-cert  Malignant neoplasm of upper-inner quadrant of right breast in female, estrogen receptor positive ([place]) - Plan: PT plan of care cert/re-cert  Aftercare following surgery for neoplasm - Plan: PT plan of care cert/re-cert  Acute pain of left shoulder - Plan: PT plan of care cert/re-cert  Neck pain - Plan: PT plan of care cert/re-cert  ONSET DATE: 0/7/12  SUBJECTIVE                                                                                                                                                                                            SUBJECTIVE STATEMENT:  Pt states she has had surgery and radiation to right breast  Last week she developed a sharp pain in her neck and left shoulder She is here for evaluation  PERTINENT HISTORY:  Patient was diagnosed on 01/20/22 with right grade 3. It measures 3.3 cm. It is ER/PR positive 90%, HER2- with a Ki67 of 1%.  She had lumpectomy with 1/11 nodes removed on 04/20/2022.  She will complete radiation July 19  PATIENT GOALS   to get an evaluation for her neck and shoulder pain   PAIN: Yes: NPRS scale: 3/10 Pain location: neck and left shoulder  Pain description: usally sore, but sometimes it feels like its burning.  Aggravating factors: can't really say  Relieving factors: tylenol   PRECAUTIONS: Active CA   HAND DOMINANCE: right  WEIGHT BEARING RESTRICTIONS No  FALLS:  Has patient fallen in last 6 months? 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 She would like to return to work but is not sure she can do the lifting that is required   LEISURE: pt does not exercise  PRIOR LEVEL OF FUNCTION: Independent   OBJECTIVE  COGNITION:  Overall cognitive status: Within functional limits for tasks assessed    POSTURE:  Forward head and rounded shoulders posture  UPPER EXTREMITY AROM/PROM:  A/PROM RIGHT  02/23/2022  RIGHT 07/05/2022   Shoulder extension 74   Shoulder flexion 165 160  Shoulder abduction 178 164  Shoulder internal rotation 67   Shoulder external rotation 90     (Blank rows = not tested)  A/PROM LEFT  02/23/2022   Shoulder extension 64   Shoulder flexion 168 175  Shoulder abduction 175 175  Shoulder internal rotation 77   Shoulder external rotation 74     (Blank rows = not  tested)    LYMPHEDEMA ASSESSMENTS:   LANDMARK RIGHT  02/23/2022 RIGHT 07/05/2022  10 cm proximal to olecranon process 25.4 26.8  Olecranon process 22.4 22.7  10 cm proximal to ulnar styloid process 20 21  Just proximal to ulnar styloid process 15.7 15.5  Across hand at thumb web space 18 17  At base of 2nd digit 6 5.7  (Blank rows = not tested)  LANDMARK LEFT  02/23/2022 LEFT 07/05/2022  10 cm proximal to olecranon process 25.5 25.5  Olecranon process 22 22.5  10 cm proximal to ulnar styloid process 20.2 20.4  Just proximal to ulnar styloid process 15.2 15.5  Across hand at thumb web space 17.2 17  At base of 2nd digit 5.7 5.6  (Blank rows = not tested)       PATIENT EDUCATION:  Education details shoulder and neck ROM Person educated: Patient Education method: Consulting civil engineer, Demonstration, Handout Education comprehension: Patient verbalized understanding and returned demonstration   HOME EXERCISE PROGRAM:   Neck ROM. Shoulder rolls, supine dowel stretches, walking program when able    ASSESSMENT:  CLINICAL IMPRESSION: Pt has 2 more sessions of radiation. She has skin discoloration on right chest and back  and has mild fullness in right upper arm .She complains of neck and shoulder pain though she has full ROM and only mild tightness in neck. She was instructed in ROM exercises and to increase her walking and activity. She was scheduled to return for SOZO screen in ~ 8 weeks and to let us know then if her neck and shoulder issues have decreased.  If not, she may benefit from short session of PT at that time to increase exercise for strengthening to help decrease pain and prepare for return to work     PT treatment/interventions: ADL/self-care home management, pt/family education, therapeutic exercise  CLINICAL DECISION MAKING: Stable/uncomplicated  EVALUATION COMPLEXITY: Low   GOALS: Goals reviewed with patient? YES  LONG TERM GOALS: (STG=LTG)   Name Target Date Goal  status  1 Pt will be able to verbalize understanding of pertinent lymphedema risk reduction practices relevant to her dx specifically related to skin care.  Baseline:  No knowledge  Achieved at eval  2 Pt will be able to return demo and/or verbalize understanding of the post op HEP related to regaining shoulder ROM. Baseline:  No knowledge  Achieved at eval  3 Pt will be able to verbalize understanding of the importance of attending the post op After Breast CA Class for further lymphedema risk reduction education and therapeutic exercise.  Baseline:  No knowledge  Achieved at eval  4 Pt will demo she has regained full shoulder ROM and function post operatively compared to baselines.  Baseline: See objective measurements taken today.  Not met   5  Pt will be independent in an upgraded neck and shoulder HEP   Achieved on 07/05/2022     PLAN:   Pt will be followed for regular SOZO screens to monitor for lymphedema as she had 11 nodes removed and has some fullness of right arm currently at end of radiation.  Check at that time to see if more skilled PT is indicated   Donato Heinz. Owens Shark PT   Norwood Levo, PT 07/05/2022, 12:40 PM

## 2022-07-05 NOTE — Patient Instructions (Signed)
          Start a walking program when the weather is better :)

## 2022-07-06 ENCOUNTER — Encounter: Payer: Self-pay | Admitting: *Deleted

## 2022-07-06 ENCOUNTER — Other Ambulatory Visit: Payer: Self-pay

## 2022-07-06 ENCOUNTER — Encounter: Payer: Self-pay | Admitting: Radiation Oncology

## 2022-07-06 ENCOUNTER — Ambulatory Visit
Admission: RE | Admit: 2022-07-06 | Discharge: 2022-07-06 | Disposition: A | Discharge status: Home / Self Care | Payer: No Typology Code available for payment source | Source: Ambulatory Visit | Attending: Radiation Oncology | Admitting: Radiation Oncology

## 2022-07-06 DIAGNOSIS — C50211 Malignant neoplasm of upper-inner quadrant of right female breast: Secondary | ICD-10-CM

## 2022-07-06 LAB — RAD ONC ARIA SESSION SUMMARY
Course Elapsed Days: 42
Plan Fractions Treated to Date: 5
Plan Prescribed Dose Per Fraction: 2 Gy
Plan Total Fractions Prescribed: 5
Plan Total Prescribed Dose: 10 Gy
Reference Point Dosage Given to Date: 60 Gy
Reference Point Session Dosage Given: 2 Gy
Session Number: 30

## 2022-07-07 ENCOUNTER — Inpatient Hospital Stay
Payer: No Typology Code available for payment source | Attending: Hematology and Oncology | Admitting: Hematology and Oncology

## 2022-07-07 ENCOUNTER — Other Ambulatory Visit: Payer: Self-pay

## 2022-07-07 DIAGNOSIS — Z923 Personal history of irradiation: Secondary | ICD-10-CM | POA: Insufficient documentation

## 2022-07-07 DIAGNOSIS — Z79810 Long term (current) use of selective estrogen receptor modulators (SERMs): Secondary | ICD-10-CM | POA: Insufficient documentation

## 2022-07-07 DIAGNOSIS — C50211 Malignant neoplasm of upper-inner quadrant of right female breast: Secondary | ICD-10-CM | POA: Insufficient documentation

## 2022-07-07 DIAGNOSIS — Z170 Estrogen receptor positive status [ER+]: Secondary | ICD-10-CM | POA: Insufficient documentation

## 2022-07-07 MED ORDER — TAMOXIFEN CITRATE 20 MG PO TABS: 20.0000 mg | ORAL_TABLET | Freq: Every day | ORAL | 3 refills | Status: DC

## 2022-07-07 NOTE — Progress Notes (Signed)
Patient Care Team: Pcp, No as PCP - General Mauro Kaufmann, RN as Oncology Nurse Navigator Rockwell Germany, RN as Oncology Nurse Navigator  DIAGNOSIS:  Encounter Diagnosis  Name Primary?   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  The report date is March 01, 2022.  MUTYH, NF2 and TSC2 VUS's identified on the CancerNext-Expanded+RNAinsight panel.  These will not change medical management.  The report date is March 09, 2022.  The CancerNext-Expanded gene panel offered by [facility] and includes sequencing and rearrangement analysis for the following 77 genes: AIP, ALK, APC*, ATM*, AXIN2, BAP1, BARD1, BLM, BMPR1A, BRCA1*, BRCA2*, BRIP1*, CDC73, CDH1*, CDK4, CDKN1B, CDKN2A, CHEK2*, CTNNA1, DICER1, FANCC, FH, FLCN, GALNT12, KIF1B, LZTR1, MAX, MEN1, MET, MLH1*, MSH2*, MSH3, MSH6*, MUTYH*, NBN, NF1*, NF2, NTHL1, PALB2*, PHOX2B, PMS2*, POT1, PRKAR1A, PTCH1, PTEN*, RAD51C*, RAD51D*, RB1, RECQL, RET, SDHA, SDHAF2, SDHB, SDHC, SDHD, SMAD4, SMARCA4, SMARCB1, SMARCE1, STK11, SUFU, TMEM127, TP53*, TSC1, TSC2, VHL and XRCC2 (sequencing and deletion/duplication); EGFR, EGLN1, HOXB13, KIT, MITF, PDGFRA, POLD1, and POLE (sequencing only); EPCAM and GREM1 (deletion/duplication  only). DNA and RNA analyses performed for * genes.    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, extends into the dermis of the nipple, margins negative, 1/11 lymph nodes positive ER 90%, PR 90%, HER2 negative, Ki-67 1%   05/02/2022 Cancer Staging   Staging form: Breast, AJCC 8th Edition - Pathologic: Stage IB (pT2, pN1, cM0, G2, ER+, PR+, HER2-) - Signed by Nicholas Lose, MD on 05/02/2022 Stage prefix: Initial diagnosis Histologic grading system: 3 grade system     CHIEF COMPLIANT: Follow up after radiation  INTERVAL HISTORY: Debra Hobbs is a 46 y.o. female is here because of recent diagnosis of invasive mammary carcinoma of the right breast. She presents to the clinic today for a follow-up. She states she is having a lot of redness and pain from radiation. She is taking tylenol but no relief. But she is managing the pain. Overall she tolerated radiation. We utilized a Spanish interpreter to discuss the plan.   ALLERGIES:  has No Known Allergies.  MEDICATIONS:  Current Outpatient Medications  Medication Sig Dispense Refill   tamoxifen (NOLVADEX) 20 MG tablet Take 1 tablet (20 mg total) by mouth daily. 90 tablet 3   acetaminophen (TYLENOL) 500 MG tablet Take 500 mg by mouth every 6 (six) hours as needed.     No current facility-administered medications for this visit.    PHYSICAL EXAMINATION: ECOG PERFORMANCE STATUS: 1 - Symptomatic but completely ambulatory  Vitals:   07/07/22 1444  BP: 112/71  Pulse: 70  Resp: 18  Temp: 97.7 F (36.5 C)  SpO2: 100%   Filed Weights   07/07/22 1444  Weight: 125 lb 3.2 oz (56.8 kg)  LABORATORY DATA:  I have reviewed the data as listed    Latest Ref Rng & Units 05/26/2022   12:12 PM  CMP  BUN 6 - 20 mg/dL 13   Creatinine 0.44 - 1.00 mg/dL 0.57     No results found for: "WBC", "HGB", "HCT", "MCV", "PLT", "NEUTROABS"  ASSESSMENT & PLAN:  Malignant neoplasm of upper-inner quadrant of right breast  in female, estrogen receptor positive ([place]) Right lumpectomy: Grade 2 IDC with DCIS 2.1 cm, extends into the dermis of the nipple, margins negative, 1/11 lymph nodes positive ER 90%, PR 90%, HER2 negative, Ki-67 1% T2N1 stage Ib  MammaPrint: Low risk Adjuvant radiation: 05/26/2022-07/06/2022  Current treatment: Adjuvant antiestrogen therapy with tamoxifen 20 mg daily to start 07/19/2022  We discussed the risks and benefits of tamoxifen. These include but not limited to insomnia, hot flashes, mood changes, vaginal dryness, and weight gain. Although rare, serious side effects including endometrial cancer, risk of blood clots were also discussed. We strongly believe that the benefits far outweigh the risks. Patient understands these risks and consented to starting treatment. Planned treatment duration is 5-10 years.   Return to back in 3 months for survivorship care plan visit   No orders of the defined types were placed in this encounter.  The patient has a good understanding of the overall plan. she agrees with it. she will call with any problems that may develop before the next visit here. Total time spent: 30 mins including face to face time and time spent for planning, charting and co-ordination of care   Harriette Ohara, MD 07/07/22    I Gardiner Coins am scribing for Dr. Lindi Adie  I have reviewed the above documentation for accuracy and completeness, and I agree with the above.

## 2022-07-07 NOTE — Assessment & Plan Note (Addendum)
Right lumpectomy: Grade 2 IDC with DCIS 2.1 cm, extends into the dermis of the nipple, margins negative, 1/11 lymph nodes positive ER 90%, PR 90%, HER2 negative, Ki-67 1% T2N1 stage Ib  MammaPrint: Low risk Adjuvant radiation: 05/26/2022-07/06/2022  Current treatment: Adjuvant antiestrogen therapy with tamoxifen 20 mg daily to start 07/19/2022  We discussed the risks and benefits of tamoxifen. These include but not limited to insomnia, hot flashes, mood changes, vaginal dryness, and weight gain. Although rare, serious side effects including endometrial cancer, risk of blood clots were also discussed. We strongly believe that the benefits far outweigh the risks. Patient understands these risks and consented to starting treatment. Planned treatment duration is 5-10 years.   Return to back in 3 months for survivorship care plan visit

## 2022-08-01 ENCOUNTER — Ambulatory Visit: Payer: No Typology Code available for payment source

## 2022-08-10 ENCOUNTER — Encounter: Payer: Self-pay | Admitting: Radiation Oncology

## 2022-08-10 NOTE — Progress Notes (Signed)
                                                                                                                                                             Patient Name: Debra Hobbs MRN: [id number] DOB: 03/28/1976 Referring Physician: Coralie Keens (Profile Not Attached) Date of Service: 07/06/2022 [facility] [place]                                                        End Of Treatment Note  Diagnoses: C50.211-Malignant neoplasm of upper-inner quadrant of right female breast  Cancer Staging:  Cancer Staging  Malignant neoplasm of upper-inner quadrant of right breast in female, estrogen receptor positive ([place]) Staging form: Breast, AJCC 8th Edition - Clinical: Stage IB (cT2, cN0, cM0, G2, ER+, PR+, HER2-) - Signed by Gardenia Phlegm, NP on 01/26/2022 Histologic grading system: 3 grade system - Pathologic: Stage IB (pT2, pN1, cM0, G2, ER+, PR+, HER2-) - Signed by Nicholas Lose, MD on 05/02/2022 Stage prefix: Initial diagnosis Histologic grading system: 3 grade system   Intent: Curative  Radiation Treatment Dates: 05/25/2022 through 07/06/2022 Site Technique Total Dose (Gy) Dose per Fx (Gy) Completed Fx Beam Energies  Breast, Right: Breast_R_IMN 3D 50/50 2 25/25 6XFFF  Breast, Right: Breast_R_PAB_SCV 3D 50/50 2 25/25 6X, 10X  Breast, Right: Breast_R_Bst specialPort 10/10 2 5/5 9E, 12E   Narrative: The patient tolerated radiation therapy relatively well.   Plan: The patient will follow-up with radiation oncology in 15mo. -----------------------------------  Eppie Gibson, MD

## 2022-08-12 ENCOUNTER — Telehealth: Payer: Self-pay | Admitting: *Deleted

## 2022-08-12 ENCOUNTER — Ambulatory Visit: Payer: Self-pay | Admitting: Radiation Oncology

## 2022-08-12 NOTE — Telephone Encounter (Signed)
CALLED PATIENT'S DAUGHTER MARIA RUBIO TO ASK ABOUT RESCHEDULING HER MOM'S MISSED FU ON 08-12-22, PATIENT'S DAUGHTER AGREED TO COME ON 08-19-22 @ 2:40 PM

## 2022-08-19 ENCOUNTER — Ambulatory Visit: Payer: Self-pay | Admitting: Radiation Oncology

## 2022-08-19 ENCOUNTER — Telehealth: Payer: Self-pay

## 2022-08-19 NOTE — Telephone Encounter (Signed)
With the help of an interpreter, I called the patient today about her upcoming follow-up appointment in radiation oncology.   Given the state of the COVID-19 pandemic, concerning case numbers in our community, and guidance from [facility], I offered a phone assessment with the patient to determine if coming to the clinic was necessary. She accepted.  Via interpreter, the patient reports lingering fatigue but confirms she's sleeping well at night. She also states she has mild swelling to her right arm, and continued stiffness to her right shoulder. She plans to address this during her PT appointment on 08/24/22 She reports good healing of her skin in the radiation fields.  Skin is intact and almost completely back to her baseline color/texture. I recommended that she continue skin care by applying oil or lotion with vitamin E to the skin in the radiation fields, BID, for 2 more months.  She confirms she has started her tamoxifen, and her only concern is mild constipation. Advised her to try OTC stool softener or Miralax to help. She verbalized understanding and agreement  Continue follow-up with medical oncology. She saw Dr. Lindi Adie on 07/07/22 and will see Annabelle Harman in the [facility] on 10/07/22. I explained that yearly mammograms are important for patients with intact breast tissue, and physical exams are important after mastectomy for patients that cannot undergo mammography.  I encouraged her to call if she had further questions or concerns about her healing. Otherwise, she will follow-up PRN in radiation oncology. Patient is pleased with this plan, and we will cancel her upcoming follow-up to reduce the risk of COVID-19 transmission.

## 2022-08-24 ENCOUNTER — Other Ambulatory Visit: Payer: Self-pay

## 2022-08-24 ENCOUNTER — Ambulatory Visit: Payer: No Typology Code available for payment source | Attending: Radiation Oncology | Admitting: Rehabilitation

## 2022-08-24 ENCOUNTER — Encounter: Payer: Self-pay | Admitting: Rehabilitation

## 2022-08-24 DIAGNOSIS — C50211 Malignant neoplasm of upper-inner quadrant of right female breast: Secondary | ICD-10-CM | POA: Insufficient documentation

## 2022-08-24 DIAGNOSIS — Z170 Estrogen receptor positive status [ER+]: Secondary | ICD-10-CM | POA: Insufficient documentation

## 2022-08-24 DIAGNOSIS — Z9189 Other specified personal risk factors, not elsewhere classified: Secondary | ICD-10-CM | POA: Insufficient documentation

## 2022-08-24 DIAGNOSIS — Z483 Aftercare following surgery for neoplasm: Secondary | ICD-10-CM | POA: Insufficient documentation

## 2022-08-24 DIAGNOSIS — R293 Abnormal posture: Secondary | ICD-10-CM | POA: Insufficient documentation

## 2022-08-24 DIAGNOSIS — N644 Mastodynia: Secondary | ICD-10-CM | POA: Insufficient documentation

## 2022-08-24 NOTE — Therapy (Signed)
OUTPATIENT PHYSICAL THERAPY BREAST CANCER EVALUATION   Patient Name: Debra Hobbs MRN: [id number] DOB:01-May-1976, 46 y.o., female Today's Date: 08/24/2022   PT End of Session - 08/24/22 1554     Visit Number 3    Number of Visits 6    Date for PT Re-Evaluation 11/16/22    PT Start Time 1400    PT Stop Time 1500    PT Time Calculation (min) 60 min    Activity Tolerance Patient tolerated treatment well    Behavior During Therapy [facility] for tasks assessed/performed              Past Medical History:  Diagnosis Date   Cancer ([place])    Breast   Family history of uterine cancer    Past Surgical History:  Procedure Laterality Date   BREAST BIOPSY Right 01/20/2022   BREAST LUMPECTOMY WITH RADIOACTIVE SEED AND SENTINEL LYMPH NODE BIOPSY Right 04/20/2022   Procedure: RIGHT BREAST LUMPECTOMY WITH RADIOACTIVE SEED AND SENTINEL LYMPH NODE BIOPSY;  Surgeon: Coralie Keens, MD;  Location: [place];  Service: General;  Laterality: Right;   Patient Active Problem List   Diagnosis Date Noted   Genetic testing 03/03/2022   Family history of uterine cancer 02/23/2022   Malignant neoplasm of upper-inner quadrant of right breast in female, estrogen receptor positive ([place]) 01/26/2022    PCP: Pcp, No  REFERRING PROVIDER: Eppie Gibson, MD  REFERRING DIAG: C50.211,Z17.0 (ICD-10-CM) - Malignant neoplasm of upper-inner quadrant of right breast in female, estrogen receptor positive ([place])   THERAPY DIAG:  Abnormal posture  Malignant neoplasm of upper-inner quadrant of right breast in female, estrogen receptor positive ([place])  Aftercare following surgery for neoplasm  At risk for lymphedema  Breast pain  ONSET DATE: 01/20/22  SUBJECTIVE                                                                                                                                                                                           SUBJECTIVE STATEMENT: I am having the  swelling in the breast and the arm.  It started when I started working. It has been 3 weeks since I started.  Has a compression bra but not using it. Insurance is not covering the bras.   PERTINENT HISTORY:  Patient was diagnosed on 01/20/22 with right grade 3. It measures 3.3 cm. It is ER/PR positive 90%, HER2- with a Ki67 of 1%. She had lumpectomy with 1/11 nodes removed on 04/20/2022.  Completed radiation.    PATIENT GOALS   reduce lymphedema risk and learn post op HEP.   PAIN:  Are you having pain? Yes  NPRS scale: 4/10 Pain location: in the axilla  Pain orientation: left  Pain description: burning  Aggravating factors: work and lifting Relieving factors: tylenol or advil  PRECAUTIONS: Rt lymphedema risk  HAND DOMINANCE: right  WEIGHT BEARING RESTRICTIONS No  FALLS:  Has patient fallen in last 6 months? 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 In the middle that pt said she is supposed to keep on until it falls off.  Some lateral breast general edema.   PALPATION: firmness left breast all over  UPPER EXTREMITY AROM/PROM:  A/PROM RIGHT  02/23/22  08/24/22  Shoulder extension 74 60  Shoulder flexion 165 161 - upper arm pull   Shoulder abduction 178 165 - pull in breast  Shoulder internal rotation 67   Shoulder external rotation 90 90    (Blank rows = not tested)  A/PROM LEFT  02/23/22 08/24/22  Shoulder extension 64   Shoulder flexion 168   Shoulder abduction 175   Shoulder internal  rotation 77   Shoulder external rotation 74     (Blank rows = not tested)  LDEX now 6.9 over baseline - see flowsheet  2 cords noted in Rt axilla but very mild and not tight.  Pectoralis tightness.    PATIENT EDUCATION:  Education details:updated HEP, use of compression, bra and foam Person educated: Patient Education method: Explanation, Demonstration, Handout Education comprehension: Patient verbalized understanding and returned demonstration  PLEASE KEEP YOUR COMPRESSION GARMENT ON DURING THE DAY TO GET THE BEST SWELLING REDUCTION. HERE ARE SOME ADDITIONAL TIPS: Do not sleep in your garment. If you have pain or notice swelling in your hand or at the top of your shoulder, call your therapist. This may be a sign that you need a different garment. 3.  Take good care of your garment so it lasts longer: Follow washing instructions on your garment label or box. Wash periodically using a mild detergent in warm water.  Do not use fabric softener or bleach.  Place garment in a mesh lingerie bag and use the gentle cycle of the washing machine or hand wash. Tumble dry low or lay flat to dry. TAKE CARE OF YOUR SKIN Apply a low pH moisturizing lotion to your skin daily Avoid scratching your skin Treat skin irritations quickly  Know the 5 warning signs of infection: redness, pain, warmth to touch, fever and increased swelling.  Call your physician immediately if you notice any of these signs of a possible infection.   HOME EXERCISE PROGRAM: Access Code: [email or web address] URL: [email or web address] Date: 08/24/2022 Prepared by: Shan Levans  Exercises - Supine Shoulder Flexion Extension AAROM with Dowel  - 1 x daily - 7 x weekly - 10 reps - 5 seconds hold - Supine Chest Stretch with Elbows Bent  - 2 x daily - 7 x weekly - 1 sets - 2 reps - 30-60seconds hold - Doorway Pec Stretch at 90 Degrees Abduction  - 2 x daily - 7 x weekly - 1 sets - 3 reps - 30-60seconds  hold - Standing Shoulder Row   - 2 x daily - 7 x weekly - 20 reps - 3-5 seconds hold  ASSESSMENT: CLINICAL IMPRESSION: Pt presents to PT now post radiation and with return to work x 3 weeks with complaints of arm and breast pain and swelling especially with work activities.  SOZO is now elevated into the yellow so pt was fitted with a sleeve and gauntlet with instruction on wearing and care.  Pt also demonstrates moderate breast firmness and swelling.  Pt is currently not wearing a compression bra but has one at home.  She denies needing any new bars.  Gave pt foam rectangle for bra with instruction on using compression especially at work.    Due to work schedule and just returning to work pt will follow up in 4 weeks and go from there in terms of more appointments.    Pt will benefit from skilled therapeutic intervention to improve on the following deficits: Decreased knowledge of precautions, impaired UE functional use, pain, decreased ROM, postural dysfunction.   PT treatment/interventions: ADL/self-care home management, pt/family education, therapeutic exercise  REHAB POTENTIAL: Good  CLINICAL DECISION MAKING: Stable/uncomplicated  EVALUATION COMPLEXITY: Low   GOALS: Goals reviewed with patient? YES  LONG TERM GOALS: (STG=LTG)   Name Target Date Goal status  1 Pt will be ind with use of compression sleeve for work 11/16/22 NEW  2 Pt will report ability to perform all work activities without limitations 11/16/22 NEW  3 Pt will be ind with MLD for the breast 11/16/22 NEW  4        PLAN: PT FREQUENCY/DURATION: EVAL and sozo follow up in 4 weeks and then reassess needs.    PLAN FOR NEXT SESSION: Redo sozo - how is sleeve and work? How is bra and breast? Teach MLD or need more visits?  INTERVENTIONS: Therapeutic exercise, manual therapy, LDEX, self care   Stark Bray, PT 08/24/2022, 5:08 PM

## 2022-09-22 ENCOUNTER — Encounter: Payer: Self-pay | Admitting: Rehabilitation

## 2022-09-22 ENCOUNTER — Ambulatory Visit: Payer: No Typology Code available for payment source | Attending: Radiation Oncology | Admitting: Rehabilitation

## 2022-09-22 DIAGNOSIS — Z483 Aftercare following surgery for neoplasm: Secondary | ICD-10-CM | POA: Insufficient documentation

## 2022-09-22 DIAGNOSIS — C50211 Malignant neoplasm of upper-inner quadrant of right female breast: Secondary | ICD-10-CM | POA: Insufficient documentation

## 2022-09-22 DIAGNOSIS — M542 Cervicalgia: Secondary | ICD-10-CM | POA: Insufficient documentation

## 2022-09-22 DIAGNOSIS — M25512 Pain in left shoulder: Secondary | ICD-10-CM | POA: Insufficient documentation

## 2022-09-22 DIAGNOSIS — R293 Abnormal posture: Secondary | ICD-10-CM | POA: Insufficient documentation

## 2022-09-22 DIAGNOSIS — Z9189 Other specified personal risk factors, not elsewhere classified: Secondary | ICD-10-CM | POA: Insufficient documentation

## 2022-09-22 DIAGNOSIS — Z170 Estrogen receptor positive status [ER+]: Secondary | ICD-10-CM | POA: Insufficient documentation

## 2022-09-22 DIAGNOSIS — N644 Mastodynia: Secondary | ICD-10-CM | POA: Insufficient documentation

## 2022-09-22 NOTE — Therapy (Addendum)
OUTPATIENT PHYSICAL THERAPY BREAST CANCER TREATMENT   Patient Name: Debra Hobbs MRN: [id number] DOB:1976-09-09, 46 y.o., female Today's Date: 09/22/2022   PT End of Session - 09/22/22 1612     Visit Number 4    Number of Visits 6    Date for PT Re-Evaluation 11/16/22    PT Start Time 1558    PT Stop Time 1645    PT Time Calculation (min) 47 min    Activity Tolerance Patient tolerated treatment well    Behavior During Therapy [facility] for tasks assessed/performed               Past Medical History:  Diagnosis Date   Cancer ([place])    Breast   Family history of uterine cancer    Past Surgical History:  Procedure Laterality Date   BREAST BIOPSY Right 01/20/2022   BREAST LUMPECTOMY WITH RADIOACTIVE SEED AND SENTINEL LYMPH NODE BIOPSY Right 04/20/2022   Procedure: RIGHT BREAST LUMPECTOMY WITH RADIOACTIVE SEED AND SENTINEL LYMPH NODE BIOPSY;  Surgeon: Coralie Keens, MD;  Location: [place];  Service: General;  Laterality: Right;   Patient Active Problem List   Diagnosis Date Noted   Genetic testing 03/03/2022   Family history of uterine cancer 02/23/2022   Malignant neoplasm of upper-inner quadrant of right breast in female, estrogen receptor positive ([place]) 01/26/2022    PCP: Pcp, No  REFERRING PROVIDER: Eppie Gibson, MD  REFERRING DIAG: C50.211,Z17.0 (ICD-10-CM) - Malignant neoplasm of upper-inner quadrant of right breast in female, estrogen receptor positive ([place])   THERAPY DIAG:  Abnormal posture  At risk for lymphedema  Malignant neoplasm of upper-inner quadrant of right breast in female, estrogen receptor positive ([place])  Breast pain  Neck pain  Acute pain of left shoulder  Aftercare following surgery for neoplasm  ONSET DATE: 01/20/22  SUBJECTIVE                                                                                                                                                                                            SUBJECTIVE STATEMENT: Work has been going well with wearing the compression sleeve. She said its a little itchy at the incision site. She has minimal pain on the right side of her breast and hurts the worse after a long day. She is wearing the compression bra at work as well. She said the foam in the compression bra was bothering her a lot at work   PERTINENT HISTORY:  Patient was diagnosed on 01/20/22 with right grade 3. It measures 3.3 cm. It is ER/PR positive 90%, HER2- with a Ki67 of 1%. She had lumpectomy  with 1/11 nodes removed on 04/20/2022.  Completed radiation.    PATIENT GOALS   reduce lymphedema risk and learn post op HEP.   PAIN:  PAIN:  Are you having pain? No Pain after work in the lateral breast/latissimus region and axilla (at cording and incision)  PRECAUTIONS: Rt lymphedema risk  HAND DOMINANCE: right  WEIGHT BEARING RESTRICTIONS No  FALLS:  Has patient fallen in last 6 months? 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    UPPER EXTREMITY AROM/PROM:  A/PROM RIGHT  02/23/22  08/24/22 09/22/2022  Shoulder extension 74 60   Shoulder flexion 165 161 - upper arm pull  155 with a pull in the upper arm   Shoulder abduction 178 165 - pull in breast 170 with a pull in the trunk   Shoulder internal rotation 67    Shoulder external rotation 90 90 85    (Blank rows = not tested)  A/PROM LEFT  02/23/22 08/24/22  Shoulder extension 64   Shoulder flexion 168   Shoulder  abduction 175   Shoulder internal rotation 77   Shoulder external rotation 74     (Blank rows = not tested)  LDEX =-8.4   TODAY'S TREATMENT  Pt permission and consent throughout each step of examination and treatment with modification and draping if requested when working on sensitive areas  Date: 09/22/22  MFR: In Supine to Rt arm in area of cording P/ROM: In Supine to Rt shoulder into flexion and abduction  Repeated SOZO which was now in the green - pt was instructed on how she no longer has to wear the sleeve 24/7 but with work activities.   Performed new exercises: LTR and seated mermaid with new handout and cueing as needed  Date: 08/24/22: Eval visit   PATIENT EDUCATION:  Education details:Compression usage cording and how PT can help  Person educated: Patient Education method: Explanation, Demonstration, Handout Education comprehension: Patient verbalized understanding and returned demonstration  HOME EXERCISE PROGRAM: Access Code: [email or web address] URL: [email or web address] Date: 08/24/2022 Prepared by: Shan Levans  Exercises - Supine Shoulder Flexion Extension AAROM with Dowel  - 1 x daily - 7 x weekly - 10 reps - 5 seconds hold - Supine Chest Stretch with Elbows Bent  - 2 x daily - 7 x weekly - 1 sets - 2 reps - 30-60seconds hold - Doorway Pec Stretch at 90 Degrees Abduction  - 2 x daily - 7 x weekly - 1 sets - 3 reps - 30-60seconds hold - Standing Shoulder Row  - 2 x daily - 7 x weekly - 20 reps - 3-5 seconds hold -Seated mermaid -LTR with goalpost arms  ASSESSMENT: CLINICAL IMPRESSION: Pt reported to clinic 4 weeks after initial evaluation. Pt responded well to therapy today. Pt had relief with the manual stretching of the cording.Pt was educated that with her SOZO score return back to below baseline she only needs to wear the sleeve when she is working, cleaning, and lifting. New stretches were added  to her HEP to help with the cording.   Pt will benefit from  skilled therapeutic intervention to improve on the following deficits: Decreased knowledge of precautions, impaired UE functional use, pain, decreased ROM, postural dysfunction.   PT treatment/interventions: ADL/self-care home management, pt/family education, therapeutic exercise  REHAB POTENTIAL: Good  CLINICAL DECISION MAKING: Stable/uncomplicated  EVALUATION COMPLEXITY: Low   GOALS: Goals reviewed with patient? YES  LONG TERM GOALS: (STG=LTG)   Name Target Date Goal status  1 Pt will be ind with use of compression sleeve for work 11/16/22 NEW  2 Pt will report ability to perform all work activities without limitations 11/16/22 NEW  3 Pt will be ind with MLD for the breast 11/16/22 NEW  4        PLAN: PT FREQUENCY/DURATION: EVAL and sozo follow up in 4 weeks and then reassess needs.    PLAN FOR NEXT SESSION: Continue manual stretch and MF of the cording  INTERVENTIONS: Therapeutic exercise, manual therapy, LDEX, self care   Alanny Rivers, Student-PT 09/22/2022, 4:46 PM

## 2022-09-29 ENCOUNTER — Encounter: Payer: Self-pay | Admitting: Rehabilitation

## 2022-09-29 ENCOUNTER — Ambulatory Visit: Payer: No Typology Code available for payment source | Admitting: Rehabilitation

## 2022-09-29 DIAGNOSIS — N644 Mastodynia: Secondary | ICD-10-CM

## 2022-09-29 DIAGNOSIS — Z9189 Other specified personal risk factors, not elsewhere classified: Secondary | ICD-10-CM

## 2022-09-29 DIAGNOSIS — R293 Abnormal posture: Secondary | ICD-10-CM

## 2022-09-29 DIAGNOSIS — C50211 Malignant neoplasm of upper-inner quadrant of right female breast: Secondary | ICD-10-CM

## 2022-09-29 NOTE — Therapy (Signed)
OUTPATIENT PHYSICAL THERAPY BREAST CANCER TREATMENT   Patient Name: Debra Hobbs MRN: [id number] DOB:Jan 07, 1976, 46 y.o., female Today's Date: 09/29/2022   PT End of Session - 09/29/22 1639     Visit Number 5    Number of Visits 6    Date for PT Re-Evaluation 11/16/22    PT Start Time 1555    PT Stop Time 1640    PT Time Calculation (min) 45 min    Activity Tolerance Patient tolerated treatment well    Behavior During Therapy [facility] for tasks assessed/performed                Past Medical History:  Diagnosis Date   Cancer ([place])    Breast   Family history of uterine cancer    Past Surgical History:  Procedure Laterality Date   BREAST BIOPSY Right 01/20/2022   BREAST LUMPECTOMY WITH RADIOACTIVE SEED AND SENTINEL LYMPH NODE BIOPSY Right 04/20/2022   Procedure: RIGHT BREAST LUMPECTOMY WITH RADIOACTIVE SEED AND SENTINEL LYMPH NODE BIOPSY;  Surgeon: Coralie Keens, MD;  Location: [place];  Service: General;  Laterality: Right;   Patient Active Problem List   Diagnosis Date Noted   Genetic testing 03/03/2022   Family history of uterine cancer 02/23/2022   Malignant neoplasm of upper-inner quadrant of right breast in female, estrogen receptor positive ([place]) 01/26/2022    PCP: Pcp, No  REFERRING PROVIDER: Eppie Gibson, MD  REFERRING DIAG: C50.211,Z17.0 (ICD-10-CM) - Malignant neoplasm of upper-inner quadrant of right breast in female, estrogen receptor positive ([place])   THERAPY DIAG:  Abnormal posture  At risk for lymphedema  Malignant neoplasm of upper-inner quadrant of right breast in female, estrogen receptor positive ([place])  Breast pain  ONSET DATE: 01/20/22  SUBJECTIVE                                                                                                                                                                                           SUBJECTIVE STATEMENT: (Using video interpreter) Doing better.   PERTINENT  HISTORY:  Patient was diagnosed on 01/20/22 with right grade 3. It measures 3.3 cm. It is ER/PR positive 90%, HER2- with a Ki67 of 1%. She had lumpectomy with 1/11 nodes removed on 04/20/2022.  Completed radiation.    PATIENT GOALS   reduce lymphedema risk and learn post op HEP.   PAIN:  PAIN:  Are you having pain? No Pain after work in the lateral breast/latissimus region and axilla (at cording and incision)  PRECAUTIONS: Rt lymphedema risk  HAND DOMINANCE: right  WEIGHT BEARING RESTRICTIONS No  FALLS:  Has patient fallen in last 6 months?  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    UPPER EXTREMITY AROM/PROM:  A/PROM RIGHT  02/23/22  08/24/22 09/22/2022 09/29/22  Shoulder extension 74 60    Shoulder flexion 165 161 - upper arm pull  155 with a pull in the upper arm    Shoulder abduction 178 165 - pull in breast 170 with a pull in the trunk    Shoulder internal rotation 67     Shoulder external rotation 90 90 85     (Blank rows = not tested)  A/PROM LEFT  02/23/22 08/24/22  Shoulder extension 64   Shoulder flexion 168   Shoulder abduction 175   Shoulder internal rotation 77   Shoulder external rotation 74     (Blank rows = not tested)  LDEX =-8.4   TODAY'S TREATMENT  Pt permission and consent throughout each step of examination and treatment with modification and draping if requested when working on sensitive areas  Date: 09/29/22  MFR/STM: In Supine to Rt arm in area of  cording, pectoralis, and periscapular muscles in sidelying P/ROM: In Supine to Rt shoulder into flexion and abduction  Pulleys into flexion and abduction x 51mn each.  Wall ball roll into flexion and abduction x 5 each, seated mermaid x 6bil, - pt reporting mostly easy   Date: 09/22/22  MFR: In Supine to Rt arm in area of cording P/ROM: In Supine to Rt shoulder into flexion and abduction  Repeated SOZO which was now in the green - pt was instructed on how she no longer has to wear the sleeve 24/7 but with work activities.   Performed new exercises: LTR and seated mermaid with new handout and cueing as needed  Date: 08/24/22: Eval visit   PATIENT EDUCATION:  Education details:Compression usage cording and how PT can help  Person educated: Patient Education method: Explanation, Demonstration, Handout Education comprehension: Patient verbalized understanding and returned demonstration  HOME EXERCISE PROGRAM: Access Code: [email or web address]: [email or web address] Date: 08/24/2022 Prepared by: KShan Levans Exercises - Supine Shoulder Flexion Extension AAROM with Dowel  - 1 x daily - 7 x weekly - 10 reps - 5 seconds hold - Supine Chest Stretch with Elbows Bent  - 2 x daily - 7 x weekly - 1 sets - 2 reps - 30-60seconds hold - Doorway Pec Stretch at 90 Degrees Abduction  - 2 x daily - 7 x weekly - 1 sets - 3 reps - 30-60seconds hold - Standing Shoulder Row  - 2 x daily - 7 x weekly - 20 reps - 3-5 seconds hold -Seated mermaid -LTR with goalpost arms  ASSESSMENT: CLINICAL IMPRESSION: Pt still has minimal cording present and is regaining AROM. Pt is also noting some breast lymphedema continuing but improved.  Interpretor used for gym and TE and check in and then pt declined for massage in room stating she can understand well.    Pt will benefit from skilled therapeutic intervention to improve on the following deficits: Decreased knowledge of  precautions, impaired UE functional use,  pain, decreased ROM, postural dysfunction.   PT treatment/interventions: ADL/self-care home management, pt/family education, therapeutic exercise  REHAB POTENTIAL: Good  CLINICAL DECISION MAKING: Stable/uncomplicated  EVALUATION COMPLEXITY: Low   GOALS: Goals reviewed with patient? YES  LONG TERM GOALS: (STG=LTG)   Name Target Date Goal status  1 Pt will be ind with use of compression sleeve for work 11/16/22 NEW  2 Pt will report ability to perform all work activities without limitations 11/16/22 NEW  3 Pt will be ind with MLD for the breast 11/16/22 NEW  4        PLAN: PT FREQUENCY/DURATION: EVAL and sozo follow up in 4 weeks and then reassess needs.    PLAN FOR NEXT SESSION: Continue manual stretch and MF of the cording  INTERVENTIONS: Therapeutic exercise, manual therapy, LDEX, self care   Stark Bray, PT 09/29/2022, 4:40 PM

## 2022-10-05 ENCOUNTER — Telehealth: Payer: Self-pay | Admitting: Adult Health

## 2022-10-05 NOTE — Telephone Encounter (Signed)
Rescheduled appointment per provider PAL. Mcarthur Rossetti with interpretation and the patient was notified.

## 2022-10-07 ENCOUNTER — Inpatient Hospital Stay: Payer: Self-pay | Admitting: Adult Health

## 2022-10-13 ENCOUNTER — Ambulatory Visit: Payer: No Typology Code available for payment source | Admitting: Physical Therapy

## 2022-10-13 ENCOUNTER — Encounter: Payer: Self-pay | Admitting: Physical Therapy

## 2022-10-13 DIAGNOSIS — Z483 Aftercare following surgery for neoplasm: Secondary | ICD-10-CM

## 2022-10-13 DIAGNOSIS — M25512 Pain in left shoulder: Secondary | ICD-10-CM

## 2022-10-13 DIAGNOSIS — R293 Abnormal posture: Secondary | ICD-10-CM

## 2022-10-13 DIAGNOSIS — C50211 Malignant neoplasm of upper-inner quadrant of right female breast: Secondary | ICD-10-CM

## 2022-10-13 DIAGNOSIS — Z9189 Other specified personal risk factors, not elsewhere classified: Secondary | ICD-10-CM

## 2022-10-13 DIAGNOSIS — N644 Mastodynia: Secondary | ICD-10-CM

## 2022-10-13 NOTE — Patient Instructions (Addendum)
Self manual lymph drainage: Perform this sequence once a day.  Only give enough pressure no your skin to make the skin move.  Diaphragmatic - Supine   Inhale through nose making navel move out toward hands. Exhale through puckered lips, hands follow navel in. Repeat _5__ times. Rest _10__ seconds between repeats.   Copyright  VHI. All rights reserved.  Hug yourself.  Do circles at your neck just above your collarbones.  Repeat this 10 times.  Axilla - One at a Time   Using full weight of flat hand and fingers at center of uninvolved armpit, make _10__ in-place circles.   Copyright  VHI. All rights reserved.  LEG: Inguinal Nodes Stimulation   With small finger side of hand against hip crease on involved side, gently perform circles at the crease. Repeat __10_ times.   Copyright  VHI. All rights reserved.  Axilla to Inguinal Nodes - Sweep   On involved side, stretch skin _4__ times from armpit along side of trunk to hip crease.  Now gently stretch skin from the involved side to the uninvolved side across the chest at the shoulder line.  Repeat that 4 times.  Draw an imaginary diagonal line from upper outer breast through the nipple area toward lower inner breast.  Direct fluid upward and inward from this line toward the pathway across your upper chest .  Do this in three rows to treat all of the upper inner breast tissue, and do each row 3-4x.      Direct fluid to treat all of lower outer breast tissue downward and outward toward  pathway that is aimed at the right groin.  Finish by doing the pathways as described above going from your involved armpit to the same side groin and going across your upper chest from the involved shoulder to the uninvolved shoulder.  Repeat the steps above where you do circles in your right groin and left armpit. Copyright  VHI. All rights reserved.

## 2022-10-13 NOTE — Therapy (Cosign Needed)
OUTPATIENT PHYSICAL THERAPY BREAST CANCER TREATMENT   Patient Name: Debra Hobbs MRN: [id number] DOB:11-Oct-1976, 46 y.o., female Today's Date: 10/13/2022   PT End of Session - 10/13/22 1500     Visit Number 6    Number of Visits 10    Date for PT Re-Evaluation 11/10/22    PT Start Time 1500    PT Stop Time 1544    PT Time Calculation (min) 44 min    Activity Tolerance Patient tolerated treatment well    Behavior During Therapy [facility] for tasks assessed/performed                 Past Medical History:  Diagnosis Date   Cancer ([place])    Breast   Family history of uterine cancer    Past Surgical History:  Procedure Laterality Date   BREAST BIOPSY Right 01/20/2022   BREAST LUMPECTOMY WITH RADIOACTIVE SEED AND SENTINEL LYMPH NODE BIOPSY Right 04/20/2022   Procedure: RIGHT BREAST LUMPECTOMY WITH RADIOACTIVE SEED AND SENTINEL LYMPH NODE BIOPSY;  Surgeon: Coralie Keens, MD;  Location: [place];  Service: General;  Laterality: Right;   Patient Active Problem List   Diagnosis Date Noted   Genetic testing 03/03/2022   Family history of uterine cancer 02/23/2022   Malignant neoplasm of upper-inner quadrant of right breast in female, estrogen receptor positive ([place]) 01/26/2022    PCP: Pcp, No  REFERRING PROVIDER: Eppie Gibson, MD  REFERRING DIAG: C50.211,Z17.0 (ICD-10-CM) - Malignant neoplasm of upper-inner quadrant of right breast in female, estrogen receptor positive ([place])   THERAPY DIAG:  Abnormal posture - Plan: PT plan of care cert/re-cert  At risk for lymphedema - Plan: PT plan of care cert/re-cert  Aftercare following surgery for neoplasm - Plan: PT plan of care cert/re-cert  Acute pain of left shoulder - Plan: PT plan of care cert/re-cert  Malignant neoplasm of upper-inner quadrant of right breast in female, estrogen receptor positive ([place]) - Plan: PT plan of care cert/re-cert  Breast pain - Plan: PT plan of care cert/re-cert  ONSET  DATE: 06/23/87  SUBJECTIVE                                                                                                                                                                                           SUBJECTIVE STATEMENT: (Using video interpreter) She is having a little bit of pain   PERTINENT HISTORY:  Patient was diagnosed on 01/20/22 with right grade 3. It measures 3.3 cm. It is ER/PR positive 90%, HER2- with a Ki67 of 1%. She had lumpectomy with 1/11 nodes removed on 04/20/2022.  Completed radiation.  PATIENT GOALS   reduce lymphedema risk and learn post op HEP.   PAIN:  PAIN:  Are you having pain? 3/10 Pain after work in the lateral breast/latissimus region and axilla (at cording and incision)  PRECAUTIONS: Rt lymphedema risk  HAND DOMINANCE: right  WEIGHT BEARING RESTRICTIONS No  FALLS:  Has patient fallen in last 6 months? 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    UPPER EXTREMITY AROM/PROM:  A/PROM RIGHT  02/23/22  08/24/22 09/22/2022 09/29/22  Shoulder extension 74 60    Shoulder flexion 165 161 - upper arm pull  155 with a pull in the upper arm    Shoulder abduction 178 165 - pull in breast 170 with a pull in the trunk    Shoulder internal rotation 67     Shoulder external rotation 90 90 85     (Blank rows = not tested)  A/PROM LEFT  02/23/22 08/24/22  Shoulder extension 64   Shoulder flexion 168   Shoulder abduction 175   Shoulder internal rotation 77    Shoulder external rotation 74     (Blank rows = not tested)  LDEX =-8.4   TODAY'S TREATMENT  Pt permission and consent throughout each step of examination and treatment with modification and draping if requested when working on sensitive areas  Date: 10/13/22  MFR/STM: In Supine to Rt arm in area of cording,  In supine: Short neck, 5 diaphragmatic breaths, L axillary nodes and establishment of interaxillary pathway, R inguinal nodes and establishment of axilloinguinal pathway, then R breast moving fluid towards pathways spending extra time in any areas of fibrosis then retracing all steps while educating pt in correct sequence and skin stretch technique throughout P/ROM: In Supine to Rt shoulder into flexion and abduction  Pulleys into flexion and abduction x 2 min each.  Wall ball roll into flexion and abduction x 10 each Date: 09/29/22  MFR/STM: In Supine to Rt arm in area of cording, pectoralis, and periscapular muscles in sidelying P/ROM: In Supine to Rt shoulder into flexion and abduction  Pulleys into flexion and abduction x 60mn each.  Wall ball roll into flexion and abduction x 5 each, seated mermaid x 6bil, - pt reporting mostly easy   Date: 09/22/22  MFR: In Supine to Rt arm in area of cording P/ROM: In Supine to Rt shoulder into flexion and abduction  Repeated SOZO which was now in the green - pt was instructed on how she no longer has to wear the sleeve 24/7 but with work activities.   Performed new exercises: LTR and seated mermaid with new handout and cueing as needed  Date: 08/24/22: Eval visit   PATIENT EDUCATION:  Education details:Compression usage cording, self MLD and how PT can help  Person educated: Patient Education method: Explanation, Demonstration, Handout Education comprehension: Patient verbalized understanding and returned demonstration  HOME EXERCISE PROGRAM: Access Code: [email or web address]: [email or web address] Date: 08/24/2022 Prepared by:  KShan Levans Exercises - Supine Shoulder Flexion Extension  AAROM with Dowel  - 1 x daily - 7 x weekly - 10 reps - 5 seconds hold - Supine Chest Stretch with Elbows Bent  - 2 x daily - 7 x weekly - 1 sets - 2 reps - 30-60seconds hold - Doorway Pec Stretch at 90 Degrees Abduction  - 2 x daily - 7 x weekly - 1 sets - 3 reps - 30-60seconds hold - Standing Shoulder Row  - 2 x daily - 7 x weekly - 20 reps - 3-5 seconds hold -Seated mermaid -LTR with goalpost arms  ASSESSMENT: CLINICAL IMPRESSION: Began instructing pt in self MLD today and issued handout. Only had handout in [place] but pt reports her daughter can help her with the instructions. She also has a recent onset of neck and low back pain that she reports began a week ago. Educated pt to follow up with her doctor regarding this since it is a new onset. Pt would benefit from additional skilled PT services to become independent in self MLD and to decrease discomfort with comfort.   Pt will benefit from skilled therapeutic intervention to improve on the following deficits: Decreased knowledge of precautions, impaired UE functional use, pain, decreased ROM, postural dysfunction.   PT treatment/interventions: ADL/self-care home management, pt/family education, therapeutic exercise  REHAB POTENTIAL: Good  CLINICAL DECISION MAKING: Stable/uncomplicated  EVALUATION COMPLEXITY: Low   GOALS: Goals reviewed with patient? YES  LONG TERM GOALS: (STG=LTG)   Name Target Date Goal status  1 Pt will be ind with use of compression sleeve for work 11/16/22 ACHIEVED 10/13/22  2 Pt will report ability to perform all work activities without limitations 11/16/22 ACHIEVED 10/13/22  3 Pt will be ind with MLD for the breast 11/16/22 ONGOING  4 Pt will report no pain in R arm secondary to cording to allow improved comfort.  11/10/22 NEW     PLAN: PT FREQUENCY/DURATION: 1x/wk for 4 weeks  PLAN FOR NEXT SESSION: Continue manual stretch and MF of the  cording, MLD to R breast and continue to instruct pt   INTERVENTIONS: Therapeutic exercise, manual therapy, LDEX, self care   [facility], PT 10/13/2022, 3:54 PM

## 2022-10-17 ENCOUNTER — Encounter: Payer: Self-pay | Admitting: Adult Health

## 2022-10-20 ENCOUNTER — Encounter: Payer: No Typology Code available for payment source | Admitting: Rehabilitation

## 2022-10-21 ENCOUNTER — Ambulatory Visit: Payer: No Typology Code available for payment source | Attending: Radiation Oncology | Admitting: Rehabilitation

## 2022-10-21 ENCOUNTER — Encounter: Payer: Self-pay | Admitting: Rehabilitation

## 2022-10-21 DIAGNOSIS — Z483 Aftercare following surgery for neoplasm: Secondary | ICD-10-CM | POA: Insufficient documentation

## 2022-10-21 DIAGNOSIS — N644 Mastodynia: Secondary | ICD-10-CM | POA: Insufficient documentation

## 2022-10-21 DIAGNOSIS — Z170 Estrogen receptor positive status [ER+]: Secondary | ICD-10-CM | POA: Insufficient documentation

## 2022-10-21 DIAGNOSIS — M25512 Pain in left shoulder: Secondary | ICD-10-CM | POA: Insufficient documentation

## 2022-10-21 DIAGNOSIS — C50211 Malignant neoplasm of upper-inner quadrant of right female breast: Secondary | ICD-10-CM | POA: Insufficient documentation

## 2022-10-21 DIAGNOSIS — Z9189 Other specified personal risk factors, not elsewhere classified: Secondary | ICD-10-CM | POA: Insufficient documentation

## 2022-10-21 DIAGNOSIS — R293 Abnormal posture: Secondary | ICD-10-CM | POA: Insufficient documentation

## 2022-10-21 NOTE — Therapy (Signed)
OUTPATIENT PHYSICAL THERAPY BREAST CANCER TREATMENT   Patient Name: Debra Hobbs MRN: [id number] DOB:May 22, 1976, 46 y.o., female Today's Date: 10/21/2022   PT End of Session - 10/21/22 0858     Visit Number 7    Number of Visits 10    Date for PT Re-Evaluation 11/10/22    PT Start Time 0903    Activity Tolerance Patient tolerated treatment well    Behavior During Therapy [facility] for tasks assessed/performed                  Past Medical History:  Diagnosis Date   Cancer ([employer])    Breast   Family history of uterine cancer    Past Surgical History:  Procedure Laterality Date   BREAST BIOPSY Right 01/20/2022   BREAST LUMPECTOMY WITH RADIOACTIVE SEED AND SENTINEL LYMPH NODE BIOPSY Right 04/20/2022   Procedure: RIGHT BREAST LUMPECTOMY WITH RADIOACTIVE SEED AND SENTINEL LYMPH NODE BIOPSY;  Surgeon: Coralie Keens, MD;  Location: [place];  Service: General;  Laterality: Right;   Patient Active Problem List   Diagnosis Date Noted   Genetic testing 03/03/2022   Family history of uterine cancer 02/23/2022   Malignant neoplasm of upper-inner quadrant of right breast in female, estrogen receptor positive ([place]) 01/26/2022    PCP: Pcp, No  REFERRING PROVIDER: Eppie Gibson, MD  REFERRING DIAG: C50.211,Z17.0 (ICD-10-CM) - Malignant neoplasm of upper-inner quadrant of right breast in female, estrogen receptor positive ([place])   THERAPY DIAG:  Abnormal posture  At risk for lymphedema  Aftercare following surgery for neoplasm  Acute pain of left shoulder  Malignant neoplasm of upper-inner quadrant of right breast in female, estrogen receptor positive ([place])  Breast pain  ONSET DATE: 01/20/22  SUBJECTIVE                                                                                                                                                                                        SUBJECTIVE STATEMENT: Some days it hurts more and some days less -  points to lateral breast and axilla. Was able to try to the self massage a little.    PERTINENT HISTORY:  Patient was diagnosed on 01/20/22 with right grade 3. It measures 3.3 cm. It is ER/PR positive 90%, HER2- with a Ki67 of 1%. She had lumpectomy with 1/11 nodes removed on 04/20/2022.  Completed radiation.    PATIENT GOALS   reduce lymphedema risk and learn post op HEP.   PAIN:  PAIN:  Are you having pain? 0/10 Pain after work in the lateral breast/latissimus region and axilla (at cording and incision)  PRECAUTIONS: Rt lymphedema risk  HAND  DOMINANCE: right  WEIGHT BEARING RESTRICTIONS No  FALLS:  Has patient fallen in last 6 months? 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    UPPER EXTREMITY AROM/PROM:  A/PROM RIGHT  02/23/22  08/24/22 09/22/2022 09/29/22  Shoulder extension 74 60    Shoulder flexion 165 161 - upper arm pull  155 with a pull in the upper arm    Shoulder abduction 178 165 - pull in breast 170 with a pull in the trunk    Shoulder internal rotation 67     Shoulder external rotation 90 90 85     (Blank rows = not tested)  A/PROM LEFT  02/23/22 08/24/22  Shoulder extension 64   Shoulder flexion 168   Shoulder abduction 175   Shoulder internal rotation 77   Shoulder external rotation 74     (Blank rows = not tested)  LDEX =-8.4   TODAY'S TREATMENT  Pt permission and consent throughout each step of examination and treatment with modification and draping if  requested when working on sensitive areas  Date: 10/21/22  MFR/STM: In Supine to Rt arm in area of cording,  In supine: Short neck, 5 diaphragmatic breaths, L axillary nodes and establishment of interaxillary pathway, R inguinal nodes and establishment of axilloinguinal pathway, then R breast moving fluid towards pathways spending extra time in any areas of fibrosis then retracing all steps while educating pt in correct sequence and skin stretch technique throughout P/ROM: In Supine to Rt shoulder into flexion and abduction  Supine dowel flexion narrow and wide grip x 5, wall ball flexion and abduction x 5 each  Date: 10/13/22  MFR/STM: In Supine to Rt arm in area of cording,  In supine: Short neck, 5 diaphragmatic breaths, L axillary nodes and establishment of interaxillary pathway, R inguinal nodes and establishment of axilloinguinal pathway, then R breast moving fluid towards pathways spending extra time in any areas of fibrosis then retracing all steps while educating pt in correct sequence and skin stretch technique throughout P/ROM: In Supine to Rt shoulder into flexion and abduction  Pulleys into flexion and abduction x 2 min each.  Wall ball roll into flexion and abduction x 10 each Date: 09/29/22  MFR/STM: In Supine to Rt arm in area of cording, pectoralis, and periscapular muscles in sidelying P/ROM: In Supine to Rt shoulder into flexion and abduction  Pulleys into flexion and abduction x 11mn each.  Wall ball roll into flexion and abduction x 5 each, seated mermaid x 6bil, - pt reporting mostly easy   Date: 09/22/22  MFR: In Supine to Rt arm in area of cording P/ROM: In Supine to Rt shoulder into flexion and abduction  Repeated SOZO which was now in the green - pt was instructed on how she no longer has to wear the sleeve 24/7 but with work activities.   Performed new exercises: LTR and seated mermaid with new handout and cueing as needed  Date: 08/24/22: Eval visit   PATIENT  EDUCATION:  Education details:Compression usage cording, self MLD and how PT can help  Person educated: Patient Education method: Explanation, Demonstration, Handout Education comprehension: Patient verbalized understanding and returned demonstration  HOME EXERCISE PROGRAM: Access Code: [email or web address] URL: [email or web address] Date: 08/24/2022 Prepared by: Shan Levans  Exercises - Supine Shoulder Flexion Extension AAROM with Dowel  - 1 x daily - 7 x weekly - 10 reps - 5 seconds hold - Supine Chest Stretch with Elbows Bent  - 2 x daily - 7 x weekly - 1 sets - 2 reps - 30-60seconds hold - Doorway Pec Stretch at 90 Degrees Abduction  - 2 x daily - 7 x weekly - 1 sets - 3 reps - 30-60seconds hold - Standing Shoulder Row  - 2 x daily - 7 x weekly - 20 reps - 3-5 seconds hold -Seated mermaid -LTR with goalpost arms  ASSESSMENT: CLINICAL IMPRESSION:  Pt is doing much better.  She has intermittent pain mostly after work and her cording and breast edema are improved since visit with this PT.    Pt will benefit from skilled therapeutic intervention to improve on the following deficits: Decreased knowledge of precautions, impaired UE functional use, pain, decreased ROM, postural dysfunction.   PT treatment/interventions: ADL/self-care home management, pt/family education, therapeutic exercise  REHAB POTENTIAL: Good  CLINICAL DECISION MAKING: Stable/uncomplicated  EVALUATION COMPLEXITY: Low   GOALS: Goals reviewed with patient? YES  LONG TERM GOALS: (STG=LTG)   Name Target Date Goal status  1 Pt will be ind with use of compression sleeve for work 11/16/22 ACHIEVED 10/13/22  2 Pt will report ability to perform all work activities without limitations 11/16/22 ACHIEVED 10/13/22  3 Pt will be ind with MLD for the breast 11/16/22 ONGOING  4 Pt will report no pain in R arm secondary to cording to allow improved comfort.  11/10/22 NEW     PLAN: PT FREQUENCY/DURATION: 1x/wk for 4  weeks  PLAN FOR NEXT SESSION: Continue manual stretch and MF of the cording, MLD to R breast and continue to instruct pt   INTERVENTIONS: Therapeutic exercise, manual therapy, LDEX, self care   Stark Bray, PT 10/21/2022, 8:59 AM

## 2022-10-27 ENCOUNTER — Ambulatory Visit: Payer: No Typology Code available for payment source | Admitting: Rehabilitation

## 2022-10-27 ENCOUNTER — Encounter: Payer: Self-pay | Admitting: Rehabilitation

## 2022-10-27 DIAGNOSIS — C50211 Malignant neoplasm of upper-inner quadrant of right female breast: Secondary | ICD-10-CM

## 2022-10-27 DIAGNOSIS — R293 Abnormal posture: Secondary | ICD-10-CM

## 2022-10-27 DIAGNOSIS — M25512 Pain in left shoulder: Secondary | ICD-10-CM

## 2022-10-27 DIAGNOSIS — Z483 Aftercare following surgery for neoplasm: Secondary | ICD-10-CM

## 2022-10-27 DIAGNOSIS — Z9189 Other specified personal risk factors, not elsewhere classified: Secondary | ICD-10-CM

## 2022-10-27 NOTE — Therapy (Signed)
OUTPATIENT PHYSICAL THERAPY BREAST CANCER TREATMENT   Patient Name: Debra Hobbs MRN: [id number] DOB:April 14, 1976, 46 y.o., female Today's Date: 10/27/2022   PT End of Session - 10/27/22 1552     Visit Number 8    Number of Visits 10    Date for PT Re-Evaluation 11/10/22    PT Start Time 1600    PT Stop Time 1649    PT Time Calculation (min) 49 min    Activity Tolerance Patient tolerated treatment well    Behavior During Therapy [facility] for tasks assessed/performed                  Past Medical History:  Diagnosis Date   Cancer ([place])    Breast   Family history of uterine cancer    Past Surgical History:  Procedure Laterality Date   BREAST BIOPSY Right 01/20/2022   BREAST LUMPECTOMY WITH RADIOACTIVE SEED AND SENTINEL LYMPH NODE BIOPSY Right 04/20/2022   Procedure: RIGHT BREAST LUMPECTOMY WITH RADIOACTIVE SEED AND SENTINEL LYMPH NODE BIOPSY;  Surgeon: Coralie Keens, MD;  Location: [facility];  Service: General;  Laterality: Right;   Patient Active Problem List   Diagnosis Date Noted   Genetic testing 03/03/2022   Family history of uterine cancer 02/23/2022   Malignant neoplasm of upper-inner quadrant of right breast in female, estrogen receptor positive ([employer]) 01/26/2022    PCP: Pcp, No  REFERRING PROVIDER: Eppie Gibson, MD  REFERRING DIAG: C50.211,Z17.0 (ICD-10-CM) - Malignant neoplasm of upper-inner quadrant of right breast in female, estrogen receptor positive ([place])   THERAPY DIAG:  Abnormal posture  At risk for lymphedema  Aftercare following surgery for neoplasm  Acute pain of left shoulder  Malignant neoplasm of upper-inner quadrant of right breast in female, estrogen receptor positive ([place])  ONSET DATE: 01/20/22  SUBJECTIVE                                                                                                                                                                                        SUBJECTIVE  STATEMENT: Reports the lateral breast/axilla hurts intermittently.     PERTINENT HISTORY:  Patient was diagnosed on 01/20/22 with right grade 3. It measures 3.3 cm. It is ER/PR positive 90%, HER2- with a Ki67 of 1%. She had lumpectomy with 1/11 nodes removed on 04/20/2022.  Completed radiation.    PATIENT GOALS   reduce lymphedema risk and learn post op HEP.   PAIN:  PAIN:  Are you having pain? 2/10 Pain after work in the lateral breast/latissimus region and axilla (at cording and incision)  PRECAUTIONS: Rt lymphedema risk  HAND DOMINANCE: right  WEIGHT BEARING RESTRICTIONS  No  FALLS:  Has patient fallen in last 6 months? 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    UPPER EXTREMITY AROM/PROM:  A/PROM RIGHT  02/23/22  08/24/22 09/22/2022 10/27/22  Shoulder extension 74 60    Shoulder flexion 165 161 - upper arm pull  155 with a pull in the upper arm  155 - pull in the arm  Shoulder abduction 178 165 - pull in breast 170 with a pull in the trunk  165 - pull in the lateral breast   Shoulder internal rotation 67     Shoulder external rotation 90 90 85     (Blank rows = not tested)  A/PROM LEFT  02/23/22 08/24/22  Shoulder extension 64   Shoulder flexion 168   Shoulder abduction 175   Shoulder internal rotation 77   Shoulder external rotation 74     (Blank rows = not tested)  LDEX =-8.4   TODAY'S TREATMENT  Pt permission and consent throughout each step of examination and treatment  with modification and draping if requested when working on sensitive areas Date: 10/27/22  MFR/STM: In Supine to Rt arm in area of cording,  In supine: Short neck, 5 diaphragmatic breaths, L axillary nodes and establishment of interaxillary pathway, R inguinal nodes and establishment of axilloinguinal pathway, then R breast moving fluid towards pathways spending extra time in any areas of fibrosis then retracing all steps while educating pt in correct sequence and skin stretch technique throughout P/ROM: In Supine to Rt shoulder into flexion and abduction  Supine dowel flexion narrow and wide grip x 5 (now easy), pulleys x 13mn into flexion Sent a note to lindsey causey after questioning pt about new back pain as she reports it is 8/10, constant, worse at night  Date: 10/21/22  MFR/STM: In Supine to Rt arm in area of cording,  In supine: Short neck, 5 diaphragmatic breaths, L axillary nodes and establishment of interaxillary pathway, R inguinal nodes and establishment of axilloinguinal pathway, then R breast moving fluid towards pathways spending extra time in any areas of fibrosis then retracing all steps while educating pt in correct sequence and skin stretch technique throughout P/ROM: In Supine to Rt shoulder into flexion and abduction  Supine dowel flexion narrow and wide grip x 5, wall ball flexion and abduction x 5 each  Date: 10/13/22  MFR/STM: In Supine to Rt arm in area of cording,  In supine: Short neck, 5 diaphragmatic breaths, L axillary nodes and establishment of interaxillary pathway, R inguinal nodes and establishment of axilloinguinal pathway, then R breast moving fluid towards pathways spending extra time in any areas of fibrosis then retracing all steps while educating pt in correct sequence and skin stretch technique throughout P/ROM: In Supine to Rt shoulder into flexion and abduction  Pulleys into flexion and abduction x 2 min each.  Wall ball roll into flexion and abduction x  10 each  PATIENT EDUCATION:  Education details:Compression usage cording, self MLD and how PT  can help  Person educated: Patient Education method: Explanation, Demonstration, Handout Education comprehension: Patient verbalized understanding and returned demonstration  HOME EXERCISE PROGRAM: Access Code: [email or web address] URL: [email or web address] Date: 08/24/2022 Prepared by: Shan Levans  Exercises - Supine Shoulder Flexion Extension AAROM with Dowel  - 1 x daily - 7 x weekly - 10 reps - 5 seconds hold - Supine Chest Stretch with Elbows Bent  - 2 x daily - 7 x weekly - 1 sets - 2 reps - 30-60seconds hold - Doorway Pec Stretch at 90 Degrees Abduction  - 2 x daily - 7 x weekly - 1 sets - 3 reps - 30-60seconds hold - Standing Shoulder Row  - 2 x daily - 7 x weekly - 20 reps - 3-5 seconds hold -Seated mermaid -LTR with goalpost arms  ASSESSMENT: CLINICAL IMPRESSION: Will have 1 more visit with continued SOZO screens. Continues with pectoralis and latissimus tightness and cording that is very mild but reports no more limitations.   Pt will benefit from skilled therapeutic intervention to improve on the following deficits: Decreased knowledge of precautions, impaired UE functional use, pain, decreased ROM, postural dysfunction.   PT treatment/interventions: ADL/self-care home management, pt/family education, therapeutic exercise  REHAB POTENTIAL: Good  CLINICAL DECISION MAKING: Stable/uncomplicated  EVALUATION COMPLEXITY: Low   GOALS: Goals reviewed with patient? YES  LONG TERM GOALS: (STG=LTG)   Name Target Date Goal status  1 Pt will be ind with use of compression sleeve for work 11/16/22 ACHIEVED 10/13/22  2 Pt will report ability to perform all work activities without limitations 11/16/22 ACHIEVED 10/13/22  3 Pt will be ind with MLD for the breast 11/16/22 ONGOING  4 Pt will report no pain in R arm secondary to cording to allow improved comfort.  11/10/22 NEW      PLAN: PT FREQUENCY/DURATION: 1x/wk for 4 weeks  PLAN FOR NEXT SESSION: Continue manual stretch and MF of the cording, MLD to R breast and continue to instruct pt - Needs SOZO around 12/22/22  INTERVENTIONS: Therapeutic exercise, manual therapy, LDEX, self care   Stark Bray, PT 10/27/2022, 8:14 PM

## 2022-11-03 ENCOUNTER — Encounter: Payer: No Typology Code available for payment source | Admitting: Rehabilitation

## 2022-11-04 ENCOUNTER — Encounter: Payer: Self-pay | Admitting: Rehabilitation

## 2022-11-04 ENCOUNTER — Ambulatory Visit: Payer: No Typology Code available for payment source | Admitting: Rehabilitation

## 2022-11-04 DIAGNOSIS — Z9189 Other specified personal risk factors, not elsewhere classified: Secondary | ICD-10-CM

## 2022-11-04 DIAGNOSIS — R293 Abnormal posture: Secondary | ICD-10-CM

## 2022-11-04 DIAGNOSIS — C50211 Malignant neoplasm of upper-inner quadrant of right female breast: Secondary | ICD-10-CM

## 2022-11-04 DIAGNOSIS — Z483 Aftercare following surgery for neoplasm: Secondary | ICD-10-CM

## 2022-11-04 DIAGNOSIS — M25512 Pain in left shoulder: Secondary | ICD-10-CM

## 2022-11-04 NOTE — Therapy (Signed)
OUTPATIENT PHYSICAL THERAPY BREAST CANCER TREATMENT   Patient Name: Debra Hobbs MRN: [id number] DOB:Oct 23, 1976, 46 y.o., female Today's Date: 11/04/2022   PT End of Session - 11/04/22 0902     Visit Number 9    Number of Visits 10    Date for PT Re-Evaluation 11/10/22    PT Start Time 0903    PT Stop Time 0952    PT Time Calculation (min) 49 min    Activity Tolerance Patient tolerated treatment well    Behavior During Therapy [facility] for tasks assessed/performed                  Past Medical History:  Diagnosis Date   Cancer ([place])    Breast   Family history of uterine cancer    Past Surgical History:  Procedure Laterality Date   BREAST BIOPSY Right 01/20/2022   BREAST LUMPECTOMY WITH RADIOACTIVE SEED AND SENTINEL LYMPH NODE BIOPSY Right 04/20/2022   Procedure: RIGHT BREAST LUMPECTOMY WITH RADIOACTIVE SEED AND SENTINEL LYMPH NODE BIOPSY;  Surgeon: Coralie Keens, MD;  Location: [place];  Service: General;  Laterality: Right;   Patient Active Problem List   Diagnosis Date Noted   Genetic testing 03/03/2022   Family history of uterine cancer 02/23/2022   Malignant neoplasm of upper-inner quadrant of right breast in female, estrogen receptor positive ([place]) 01/26/2022    PCP: Pcp, No  REFERRING PROVIDER: Eppie Gibson, MD  REFERRING DIAG: C50.211,Z17.0 (ICD-10-CM) - Malignant neoplasm of upper-inner quadrant of right breast in female, estrogen receptor positive ([place])   THERAPY DIAG:  Abnormal posture  At risk for lymphedema  Aftercare following surgery for neoplasm  Acute pain of left shoulder  Malignant neoplasm of upper-inner quadrant of right breast in female, estrogen receptor positive ([place])  ONSET DATE: 01/20/22  SUBJECTIVE                                                                                                                                                                                        SUBJECTIVE  STATEMENT:   I feel ready for the last day.  I am having much less pain in the back.    PERTINENT HISTORY:  Patient was diagnosed on 01/20/22 with right grade 3. It measures 3.3 cm. It is ER/PR positive 90%, HER2- with a Ki67 of 1%. She had lumpectomy with 1/11 nodes removed on 04/20/2022.  Completed radiation.    PATIENT GOALS   reduce lymphedema risk and learn post op HEP.   PAIN:  PAIN:  Are you having pain? 2/10 now - it hurt more when I went back to work Monday.  Around a  4/10 in the axilla.  The breast felt a little swollen but I didn' t have the bra on Pain after work in the lateral breast/latissimus region and axilla (at cording and incision)  PRECAUTIONS: Rt lymphedema risk  HAND DOMINANCE: right  WEIGHT BEARING RESTRICTIONS No  FALLS:  Has patient fallen in last 6 months? 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    UPPER EXTREMITY AROM/PROM:  A/PROM RIGHT  02/23/22  08/24/22 09/22/2022 10/27/22 11/04/22  Shoulder extension 74 60   70  Shoulder flexion 165 161 - upper arm pull  155 with a pull in the upper arm  155 - pull in the arm 160 - no pull in the arm  Shoulder abduction 178 165 - pull in breast 170 with a pull in the trunk  165 - pull in the lateral breast  175  Shoulder internal rotation 67      Shoulder external rotation 90 90 85  80    (Blank rows = not tested)  A/PROM LEFT  02/23/22 08/24/22  Shoulder extension 64   Shoulder flexion 168   Shoulder abduction 175    Shoulder internal rotation 77   Shoulder external rotation 74     (Blank rows = not tested)  LDEX =-8.4   TODAY'S TREATMENT  Pt permission and consent throughout each step of examination and treatment with modification and draping if requested when working on sensitive areas Date: 11/04/22   Reviewed all goals and ROM/MMT MFR/STM: In Supine to Rt arm in area of cording which is very mild  P/ROM: In Supine to Rt shoulder into flexion and abduction  pulleys x 43mn into flexion and abduction Ball childs pose stretches are easy  Reviewed final HEP and plan  Date: 10/27/22  MFR/STM: In Supine to Rt arm in area of cording,  In supine: Short neck, 5 diaphragmatic breaths, L axillary nodes and establishment of interaxillary pathway, R inguinal nodes and establishment of axilloinguinal pathway, then R breast moving fluid towards pathways spending extra time in any areas of fibrosis then retracing all steps while educating pt in correct sequence and skin stretch technique throughout P/ROM: In Supine to Rt shoulder into flexion and abduction  Supine dowel flexion narrow and wide grip x 5 (now easy), pulleys x 377m into flexion Sent a note to lindsey causey after questioning pt about new back pain as she reports it is 8/10, constant, worse at night  Date: 10/21/22  MFR/STM: In Supine to Rt arm in area of cording,  In supine: Short neck, 5 diaphragmatic breaths, L axillary nodes and establishment of interaxillary pathway, R inguinal nodes and establishment of axilloinguinal pathway, then R breast moving fluid towards pathways spending extra time in any areas of fibrosis then retracing all steps while educating pt in correct sequence and skin stretch technique throughout P/ROM: In Supine to Rt shoulder into flexion and abduction  Supine dowel flexion narrow and wide grip x 5, wall ball flexion and abduction x 5 each  Date: 10/13/22  MFR/STM: In Supine to Rt arm in area of cording,  In supine: Short  neck, 5 diaphragmatic breaths, L axillary nodes and establishment of interaxillary pathway, R inguinal nodes and establishment of axilloinguinal pathway, then R breast moving fluid towards pathways spending extra time in any areas of fibrosis then retracing all steps while educating pt in correct sequence and skin stretch technique throughout P/ROM: In Supine to Rt shoulder into flexion and abduction  Pulleys into flexion and abduction x 2 min each.  Wall ball roll into flexion and abduction x 10 each  PATIENT EDUCATION:  Education details:Compression usage cording, self MLD and how PT can help  Person educated: Patient Education method: Explanation, Demonstration, Handout Education comprehension: Patient verbalized understanding and returned demonstration  HOME EXERCISE PROGRAM: Access Code: [email or web address] URL: [email or web address] Date: 08/24/2022 Prepared by: Shan Levans  Exercises - Supine Shoulder Flexion Extension AAROM with Dowel  - 1 x daily - 7 x weekly - 10 reps - 5 seconds hold - Supine Chest Stretch with Elbows Bent  - 2 x daily - 7 x weekly - 1 sets - 2 reps - 30-60seconds hold - Doorway Pec Stretch at 90 Degrees Abduction  - 2 x daily - 7 x weekly - 1 sets - 3 reps - 30-60seconds hold - Standing Shoulder Row  - 2 x daily - 7 x weekly - 20 reps - 3-5 seconds hold -Seated mermaid -LTR with goalpost arms  ASSESSMENT: CLINICAL IMPRESSION: Pt is ready for DC with almost all goals met, but 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  Will continue SOZO screens.   Pt will benefit from skilled therapeutic intervention to improve on the following deficits: Decreased knowledge of precautions, impaired UE functional use, pain, decreased ROM, postural dysfunction.   PT treatment/interventions: ADL/self-care home management, pt/family education, therapeutic exercise  REHAB POTENTIAL:  Good  CLINICAL DECISION MAKING: Stable/uncomplicated  EVALUATION COMPLEXITY: Low   GOALS: Goals reviewed with patient? YES  LONG TERM GOALS: (STG=LTG)   Name Target Date Goal status  1 Pt will be ind with use of compression sleeve for work 11/16/22 ACHIEVED 10/13/22  2 Pt will report ability to perform all work activities without limitations 11/16/22 ACHIEVED 10/13/22  3 Pt will be ind with MLD for the breast 11/16/22 ACHIEVED 11/04/22  4 Pt will report no pain in R arm secondary to cording to allow improved comfort.  11/10/22 PARTIALLY MET 11/04/22     PLAN: PT FREQUENCY/DURATION: 1x/wk for 4 weeks  PLAN FOR NEXT SESSION: Continue manual stretch and MF of the cording, MLD to R breast and continue to instruct pt - Needs SOZO around 12/22/22  INTERVENTIONS: Therapeutic exercise, manual therapy, LDEX, self care   Stark Bray, PT 11/04/2022, 9:52 AM   PHYSICAL THERAPY DISCHARGE SUMMARY  Visits from Start of Care: 9  Current functional level related to goals / functional outcomes: See above   Remaining deficits: Lymphedema risk,  breast pain   Education / Equipment: Final HEP  Plan: Patient agrees to discharge.   Patient is being discharged due to meeting the stated rehab goals.     Shan Levans, PT

## 2022-11-21 ENCOUNTER — Other Ambulatory Visit: Payer: Self-pay

## 2022-11-21 ENCOUNTER — Inpatient Hospital Stay: Payer: No Typology Code available for payment source | Attending: Adult Health | Admitting: Adult Health

## 2022-11-21 VITALS — BP 121/22 | HR 74 | Temp 97.7°F | Resp 18 | Ht 60.0 in | Wt 117.9 lb

## 2022-11-21 DIAGNOSIS — Z79810 Long term (current) use of selective estrogen receptor modulators (SERMs): Secondary | ICD-10-CM | POA: Insufficient documentation

## 2022-11-21 DIAGNOSIS — Z923 Personal history of irradiation: Secondary | ICD-10-CM | POA: Insufficient documentation

## 2022-11-21 DIAGNOSIS — C50211 Malignant neoplasm of upper-inner quadrant of right female breast: Secondary | ICD-10-CM | POA: Insufficient documentation

## 2022-11-21 DIAGNOSIS — Z170 Estrogen receptor positive status [ER+]: Secondary | ICD-10-CM | POA: Insufficient documentation

## 2022-11-21 NOTE — Progress Notes (Signed)
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  The report date is March 01, 2022.  MUTYH, NF2 and TSC2 VUS's identified on the CancerNext-Expanded+RNAinsight panel.  These will not change medical management.  The report date is March 09, 2022.  The CancerNext-Expanded gene panel offered by [facility] and includes sequencing and rearrangement analysis for the following 77 genes: AIP, ALK, APC*, ATM*, AXIN2, BAP1, BARD1, BLM, BMPR1A, BRCA1*, BRCA2*, BRIP1*, CDC73, CDH1*, CDK4, CDKN1B, CDKN2A, CHEK2*, CTNNA1, DICER1, FANCC, FH, FLCN, GALNT12, KIF1B, LZTR1, MAX, MEN1, MET, MLH1*, MSH2*, MSH3, MSH6*, MUTYH*, NBN, NF1*, NF2, NTHL1, PALB2*, PHOX2B, PMS2*, POT1, PRKAR1A, PTCH1, PTEN*, RAD51C*, RAD51D*, RB1, RECQL, RET, SDHA, SDHAF2, SDHB, SDHC, SDHD, SMAD4, SMARCA4, SMARCB1, SMARCE1, STK11, SUFU, TMEM127, TP53*, TSC1, TSC2, VHL and XRCC2 (sequencing and deletion/duplication); EGFR, EGLN1, HOXB13, KIT, MITF, PDGFRA, POLD1, and POLE (sequencing only); EPCAM and GREM1 (deletion/duplication only). DNA and RNA analyses performed for * genes.    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, extends into the dermis of the nipple, margins negative, 1/11 lymph nodes positive ER 90%, PR 90%, HER2 negative, Ki-67 1%   05/02/2022 Cancer Staging   Staging form:  Breast, AJCC 8th Edition - Pathologic: Stage IB (pT2, pN1, cM0, G2, ER+, PR+, HER2-) - Signed by Nicholas Lose, MD on 05/02/2022 Stage prefix: Initial diagnosis Histologic grading system: 3 grade system   05/25/2022 - 07/06/2022 Radiation Therapy   Site Technique Total Dose (Gy) Dose per Fx (Gy) Completed Fx Beam Energies  Breast, Right: Breast_R_IMN 3D 50/50 2 25/25 6XFFF  Breast, Right: Breast_R_PAB_SCV 3D 50/50 2 25/25 6X, 10X  Breast, Right: Breast_R_Bst specialPort 10/10 2 5/5 9E, 12E     07/19/2022 -  Anti-estrogen oral therapy   20 mg Tamoxifen x 5-10 years     INTERVAL HISTORY:  [name] to review her survivorship care plan detailing her treatment course for breast cancer, as well as monitoring long-term side effects of that treatment, education regarding health maintenance, screening, and overall wellness and health promotion.   We received translation through [employer].  Overall, [name] reports feeling quite well.  She is taking Tamoxifen daily with good tolerance.  She occasionally has abd. Discomfort but this is infrequent. She has an occasional hot flash with the Tamoxifen.  REVIEW OF SYSTEMS:  Review of Systems  Constitutional:  Negative for appetite change, chills, fatigue, fever and unexpected weight change.  HENT:   Negative for hearing loss, lump/mass and trouble swallowing.   Eyes:  Negative for eye problems and icterus.  Respiratory:  Negative for chest tightness, cough and shortness of breath.   Cardiovascular:  Negative for chest pain, leg swelling and palpitations.  Gastrointestinal:  Negative for abdominal distention, abdominal pain, constipation, diarrhea, nausea and vomiting.  Endocrine: Positive for hot flashes.  Genitourinary:  Negative for difficulty urinating.   Musculoskeletal:  Negative for arthralgias.  Skin:  Negative for itching and rash.  Neurological:  Negative for dizziness, extremity weakness, headaches and numbness.   Hematological:  Negative for adenopathy. Does not bruise/bleed easily.  Psychiatric/Behavioral:  Negative for depression. The patient is not nervous/anxious.    Breast: Denies any new nodularity, masses, tenderness, nipple changes, or nipple discharge.     PAST MEDICAL/SURGICAL HISTORY:  Past Medical History:  Diagnosis Date   Cancer [facility])    Breast   Family history of uterine cancer    Past Surgical History:  Procedure Laterality Date   BREAST BIOPSY Right 01/20/2022   BREAST LUMPECTOMY WITH RADIOACTIVE SEED AND SENTINEL LYMPH NODE BIOPSY Right 04/20/2022   Procedure: RIGHT BREAST LUMPECTOMY WITH RADIOACTIVE SEED AND SENTINEL LYMPH NODE BIOPSY;  Surgeon: Coralie Keens, MD;  Location: [place];  Service: General;  Laterality: Right;     ALLERGIES:  No Known Allergies   CURRENT MEDICATIONS:  Outpatient Encounter Medications as of 11/21/2022  Medication Sig   acetaminophen (TYLENOL) 500 MG tablet Take 500 mg by mouth every 6 (six) hours as needed.   tamoxifen (NOLVADEX) 20 MG tablet Take 1 tablet (20 mg total) by mouth daily.   No facility-administered encounter medications on file as of 11/21/2022.     ONCOLOGIC FAMILY HISTORY:  Family History  Problem Relation Age of Onset   Uterine cancer Cousin        paternal first cousin   Breast cancer Neg Hx       SOCIAL HISTORY:  Social History   Socioeconomic History   Marital status: Single    Spouse name: Not on file   Number of children: 2   Years of education: Not on file   Highest education level: 9th grade  Occupational History   Not on file  Tobacco Use   Smoking status: Never   Smokeless tobacco: Never  Vaping Use   Vaping Use: Never used  Substance and Sexual Activity   Alcohol use: Not Currently   Drug use: Never   Sexual activity: Not Currently  Other Topics Concern   Not on file  Social History Narrative   Not on file   Social Determinants of Health   Financial Resource  Strain: Not on file  Food Insecurity: No Food Insecurity (01/04/2022)   Hunger Vital Sign    Worried About Running Out of Food in the Last Year: Never true    Ran Out of Food in the Last Year: Never true  Transportation Needs: No Transportation Needs (01/04/2022)   PRAPARE - Hydrologist (Medical): No    Lack of Transportation (Non-Medical): No  Physical Activity: Not on file  Stress: Not on file  Social Connections: Not on file  Intimate Partner Violence: Not on file     OBSERVATIONS/OBJECTIVE:  BP (!) 121/22 (BP Location: Left Arm, Patient Position: Sitting)   Pulse 74   Temp 97.7 F (36.5 C) (Tympanic)   Resp 18   Ht 5' (1.524 m)   Wt 117 lb 14.4 oz (53.5 kg)   SpO2 100%   BMI 23.03 kg/m  GENERAL: Patient is a well appearing female in no acute distress HEENT:  Sclerae anicteric.  Oropharynx clear and moist. No ulcerations or evidence of oropharyngeal candidiasis. Neck is supple.  NODES:  No cervical, supraclavicular, or axillary lymphadenopathy palpated.  BREAST EXAM: Right breast status postlumpectomy and radiation no sign of local recurrence left breast is benign. LUNGS:  Clear to auscultation bilaterally.  No wheezes  or rhonchi. HEART:  Regular rate and rhythm. No murmur appreciated. ABDOMEN:  Soft, nontender.  Positive, normoactive bowel sounds. No organomegaly palpated. MSK:  No focal spinal tenderness to palpation. Full range of motion bilaterally in the upper extremities. EXTREMITIES:  No peripheral edema.   SKIN:  Clear with no obvious rashes or skin changes. No nail dyscrasia. NEURO:  Nonfocal. Well oriented.  Appropriate affect.   LABORATORY DATA:  None for this visit.  DIAGNOSTIC IMAGING:  None for this visit.      ASSESSMENT AND PLAN:  Ms.. Debra Hobbs is a pleasant 46 y.o. female with Stage IB right breast invasive ductal carcinoma, ER+/PR+/HER2-, diagnosed in January 2023, treated with lumpectomy, adjuvant radiation  therapy, and anti-estrogen therapy with tamoxifen beginning in August 2023.  She presents to the Survivorship Clinic for our initial meeting and routine follow-up post-completion of treatment for breast cancer.    1. Stage 1B right breast cancer:  [name] is continuing to recover from definitive treatment for breast cancer. She will follow-up with her medical oncologist, Dr. Lindi Adie in 6 months with history and physical exam per surveillance protocol.  She will continue her anti-estrogen therapy with tamoxifen. Thus far, she is tolerating the tamoxifen well, with minimal side effects. She was instructed to make Dr. Lindi Adie or myself aware if she begins to experience any worsening side effects of the medication and I could see her back in clinic to help manage those side effects, as needed. Her mammogram is due May 2024; orders placed today.  Today, a comprehensive survivorship care plan and treatment summary was reviewed with the patient today detailing her breast cancer diagnosis, treatment course, potential late/long-term effects of treatment, appropriate follow-up care with recommendations for the future, and patient education resources.  A copy of this summary, along with a letter will be sent to the patient's primary care provider via mail/fax/In Basket message after today's visit.    2. Bone health:    She was given education on specific activities to promote bone health.  3. Cancer screening:  Due to [name]'s history and her age, she should receive screening for skin cancers, colon cancer, and gynecologic cancers.  The information and recommendations are listed on the patient's comprehensive care plan/treatment summary and were reviewed in detail with the patient.    4. Health maintenance and wellness promotion: [name] was encouraged to consume 5-7 servings of fruits and vegetables per day. We reviewed the "Nutrition Rainbow" handout.  She was also encouraged to engage  in moderate to vigorous exercise for 30 minutes per day most days of the week. We discussed the [employer] fitness program, which is designed for cancer survivors to help them become more physically fit after cancer treatments.  She was instructed to limit her alcohol consumption and continue to abstain from tobacco use.     5. Support services/counseling: It is not uncommon for this period of the patient's cancer care trajectory to be one of many emotions and stressors.  She was given information regarding our available services and encouraged to contact me with any questions or for help enrolling in any of our support group/programs.    Follow up instructions:    -Return to cancer center in 6 months for follow-up with Dr. Lindi Adie -Mammogram due in May 2024 -She is welcome to return back to the Survivorship Clinic at any time; no additional follow-up needed at this time.  -Consider referral back to survivorship as a long-term survivor  for continued surveillance  The patient was provided an opportunity to ask questions and all were answered. The patient agreed with the plan and demonstrated an understanding of the instructions.   Total encounter time:40 minutes*in face-to-face visit time, chart review, lab review, care coordination, order entry, and documentation of the encounter time.    Wilber Bihari, NP 11/21/22 3:50 PM Medical Oncology and Hematology [facility] [place], [place] [address] Tel. [phone]    Fax. [phone]  *Total Encounter Time as defined by the Centers for Medicare and Medicaid Services includes, in addition to the face-to-face time of a patient visit (documented in the note above) non-face-to-face time: obtaining and reviewing outside history, ordering and reviewing medications, tests or procedures, care coordination (communications with other health care professionals or caregivers) and documentation in the medical record.

## 2022-11-24 ENCOUNTER — Other Ambulatory Visit: Payer: Self-pay

## 2022-11-24 DIAGNOSIS — Z853 Personal history of malignant neoplasm of breast: Secondary | ICD-10-CM

## 2022-12-28 ENCOUNTER — Ambulatory Visit: Payer: Self-pay | Admitting: Rehabilitation

## 2022-12-29 ENCOUNTER — Ambulatory Visit: Payer: No Typology Code available for payment source | Attending: Radiation Oncology | Admitting: Rehabilitation

## 2022-12-29 DIAGNOSIS — C50211 Malignant neoplasm of upper-inner quadrant of right female breast: Secondary | ICD-10-CM | POA: Insufficient documentation

## 2022-12-29 DIAGNOSIS — Z170 Estrogen receptor positive status [ER+]: Secondary | ICD-10-CM | POA: Insufficient documentation

## 2022-12-29 DIAGNOSIS — Z483 Aftercare following surgery for neoplasm: Secondary | ICD-10-CM | POA: Insufficient documentation

## 2022-12-30 NOTE — Therapy (Signed)
  OUTPATIENT PHYSICAL THERAPY SOZO SCREENING NOTE   Patient Name: Debra Hobbs MRN: [id number] DOB:[birth year], 47 y.o., female Today's Date: 12/30/2022  PCP: Merryl Hacker, No REFERRING PROVIDER: Eppie Gibson, MD   PT End of Session - 12/30/22 1240     Visit Number 9   screen only   PT Start Time 1652    PT Stop Time 1700    PT Time Calculation (min) 8 min    Activity Tolerance Patient tolerated treatment well    Behavior During Therapy [facility] for tasks assessed/performed             Past Medical History:  Diagnosis Date   Cancer ([place])    Breast   Family history of uterine cancer    Past Surgical History:  Procedure Laterality Date   BREAST BIOPSY Right 01/20/2022   BREAST LUMPECTOMY WITH RADIOACTIVE SEED AND SENTINEL LYMPH NODE BIOPSY Right 04/20/2022   Procedure: RIGHT BREAST LUMPECTOMY WITH RADIOACTIVE SEED AND SENTINEL LYMPH NODE BIOPSY;  Surgeon: Coralie Keens, MD;  Location: [place];  Service: General;  Laterality: Right;   Patient Active Problem List   Diagnosis Date Noted   Genetic testing 03/03/2022   Family history of uterine cancer 02/23/2022   Malignant neoplasm of upper-inner quadrant of right breast in female, estrogen receptor positive ([place]) 01/26/2022    REFERRING DIAG: right breast cancer at risk for lymphedema  THERAPY DIAG:  Aftercare following surgery for neoplasm  Malignant neoplasm of upper-inner quadrant of right breast in female, estrogen receptor positive ([place])  PERTINENT HISTORY: Patient was diagnosed on 01/20/22 with right grade 3. It measures 3.3 cm. It is ER/PR positive 90%, HER2- with a Ki67 of 1%. She had lumpectomy with 1/11 nodes removed on 04/20/2022.  Completed radiation.     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   PAIN:  Are you having pain? Not currently - yes with work  SOZO  SCREENING: Patient was assessed today using the SOZO machine to determine the lymphedema index score. This was compared to her baseline score. It was determined that she is within the recommended range when compared to her baseline and no further action is needed at this time. She will continue SOZO screenings. These are done every 3 months for 2 years post operatively followed by every 6 months for 2 years, and then annually.  PLAN:  Continue SOZO every 3 months until at least 04/20/24.  Pt already has a sleeve that she wears at work.   Stark Bray, PT 12/30/2022, 12:41 PM

## 2023-01-17 ENCOUNTER — Ambulatory Visit
Admission: RE | Admit: 2023-01-17 | Discharge: 2023-01-17 | Disposition: A | Discharge status: Home / Self Care | Payer: No Typology Code available for payment source | Source: Ambulatory Visit | Attending: Obstetrics and Gynecology | Admitting: Obstetrics and Gynecology

## 2023-01-17 ENCOUNTER — Ambulatory Visit: Payer: Self-pay | Admitting: *Deleted

## 2023-01-17 VITALS — BP 103/67 | Wt 114.0 lb

## 2023-01-17 DIAGNOSIS — Z1211 Encounter for screening for malignant neoplasm of colon: Secondary | ICD-10-CM

## 2023-01-17 DIAGNOSIS — N644 Mastodynia: Secondary | ICD-10-CM

## 2023-01-17 DIAGNOSIS — Z853 Personal history of malignant neoplasm of breast: Secondary | ICD-10-CM

## 2023-01-17 DIAGNOSIS — Z1239 Encounter for other screening for malignant neoplasm of breast: Secondary | ICD-10-CM

## 2023-01-17 HISTORY — DX: Personal history of irradiation: Z92.3

## 2023-01-17 HISTORY — DX: Malignant neoplasm of unspecified site of unspecified female breast: C50.919

## 2023-01-17 NOTE — Progress Notes (Signed)
[name] is a 47 y.o. female who presents to [facility] clinic today with complaint of right breast swelling x 3 months. Complaints of right breast pain for over 5 years that was noted on previous exam 01/04/2022. Patient states the pain comes and goes. Patient rates the pain at a 5 out of 10.    Pap Smear: Pap smear not completed today. Last Pap smear was 12/28/2021 at the [facility] clinic and patient stated she has not received the results. Per patient has no history of an abnormal Pap smear. Last Pap smear result is available in Epic.    Physical exam: Breasts Breasts symmetrical. No skin abnormalities bilateral breasts. No nipple retraction left breast. Right nipple absent due to history of lumpectomy due to breast cancer. No nipple discharge bilateral breasts. No lymphadenopathy. No lumps palpated bilateral breasts. Complaints of right outer breast pain on exam.     MM Breast Surgical Specimen  Result Date: 04/20/2022 CLINICAL DATA:  Post lumpectomy specimen radiograph EXAM: SPECIMEN RADIOGRAPH OF THE RIGHT BREAST COMPARISON:  None Available. FINDINGS: Status post excision of the right breast. The radioactive seed and biopsy marker clip are present and completely intact. IMPRESSION: Specimen radiograph of the right breast. Electronically Signed   By: Audie Pinto M.D.   On: 04/20/2022 11:25  MM RT RADIOACTIVE SEED LOC MAMMO GUIDE  Result Date: 04/19/2022 CLINICAL DATA:  47 year old female with newly diagnosed invasive mammary carcinoma of the right breast present in for seed localization. EXAM: MAMMOGRAPHIC GUIDED RADIOACTIVE SEED LOCALIZATION OF THE RIGHT BREAST COMPARISON:  None Available. FINDINGS: Patient presents for radioactive seed localization prior to . I met with the patient and we discussed the procedure of seed localization including benefits and alternatives. We discussed the high likelihood of a successful procedure. We discussed the risks of the  procedure including infection, bleeding, tissue injury and further surgery. We discussed the low dose of radioactivity involved in the procedure. Informed, written consent was given. The usual time-out protocol was performed immediately prior to the procedure. Using mammographic guidance, sterile technique, 1% lidocaine and an I-125 radioactive seed, the ribbon biopsy marking clip was localized using a medial approach. The follow-up mammogram images confirm the seed in the expected location and were marked for Dr. Ninfa Linden. Follow-up survey of the patient confirms presence of the radioactive seed. Order number of I-125 seed:  [id number]. Total activity: 0.251 mCi reference Date: March 09, 2022 The patient tolerated the procedure well and was released from the [place]. She was given instructions regarding seed removal. IMPRESSION: Radioactive seed localization right breast. No apparent complications. Electronically Signed   By: Audie Pinto M.D.   On: 04/19/2022 14:25  MM CLIP PLACEMENT RIGHT  Result Date: 01/20/2022 CLINICAL DATA:  Status post ultrasound-guided core biopsy of RIGHT breast mass. EXAM: 3D DIAGNOSTIC RIGHT MAMMOGRAM POST ULTRASOUND BIOPSY COMPARISON:  Previous exam(s). FINDINGS: 3D Mammographic images were obtained following ultrasound guided biopsy of mass in the 1 o'clock retroareolar region of the RIGHT breast and placement of a ribbon shaped. The biopsy marking clip is in expected position at the site of biopsy. IMPRESSION: Appropriate positioning of the ribbon shaped biopsy marking clip at the site of biopsy in the retroareolar RIGHT breast. Final Assessment: Post Procedure Mammograms for Marker Placement Electronically Signed   By: Nolon Nations M.D.   On: 01/20/2022 08:30  MS DIGITAL DIAG TOMO BILAT  Result Date: 01/13/2022 CLINICAL DATA:  47 year old female presenting for evaluation of progressive right nipple  inversion over the past year. This is the patient's baseline  exam. EXAM: DIGITAL DIAGNOSTIC BILATERAL MAMMOGRAM WITH TOMOSYNTHESIS AND CAD; ULTRASOUND RIGHT BREAST LIMITED TECHNIQUE: Bilateral digital diagnostic mammography and breast tomosynthesis was performed. The images were evaluated with computer-aided detection.; Targeted ultrasound examination of the right breast was performed COMPARISON:  None. ACR Breast Density Category d: The breast tissue is extremely dense, which lowers the sensitivity of mammography. FINDINGS: Pronounced distortion is noted in the anterior retroareolar right breast. No other suspicious calcifications, masses or areas of distortion are seen in the bilateral breasts. Physical exam of the right breast demonstrates asymmetric partial retraction of the right nipple. Ultrasound targeted to the retroareolar right breast demonstrates an ill-defined hypoechoic area with sonographic distortion between 12 and 1 o'clock. The most masslike portion of the abnormality is at 12 o'clock, 2 cm from the nipple, but all together, the abnormality spans approximately 3.3 x 1.8 x 2.9 cm as measured at 1 o'clock, 1 cm from the nipple. Measurement is difficult as the margins are very indistinct. Ultrasound of the right axilla demonstrates multiple normal-appearing lymph nodes. IMPRESSION: 1. There is a highly suspicious mass in the retroareolar right breast between 12 and 1 o'clock measuring at least 3.3 cm, though measurement is difficult due to the indistinct appearance. 2.  No evidence of right axillary lymphadenopathy. RECOMMENDATION: 1. Ultrasound-guided biopsy is recommended for the right breast mass, and has been scheduled for 01/20/2022 at 7:30 a.m. 2. If malignancy is found on pathology, and the patient desires breast conservation, MRI is recommended to determine true extent of disease given the ill-defined appearance of the mass, the patient's breast tissue density and her premenopausal status. I have discussed the findings and recommendations with the  patient. If applicable, a reminder letter will be sent to the patient regarding the next appointment. BI-RADS CATEGORY  5: Highly suggestive of malignancy. Electronically Signed   By: Ammie Ferrier M.D.   On: 01/13/2022 15:15    Pelvic/Bimanual Pap is not indicated today per BCCCP guidelines.    Smoking History: Patient has never smoked.    Patient Navigation: Patient education provided. Access to services provided for patient through [place] program. Spanish interpreter Rudene Anda from [facility]  provided.   Colorectal Cancer Screening: Per patient has never had colonoscopy completed. FIT Test given to patient to complete. No complaints today.    Breast and Cervical Cancer Risk Assessment: Patient does not have family history of breast cancer, known genetic mutations, or radiation treatment to the chest before age 36. Patient does not have history of cervical dysplasia, immunocompromised, or DES exposure in-utero.  Risk Scores as of 01/17/2023     [place] Jan[place]           5-year 2.26 %   Lifetime 16.02 %   This patient is [place]/Lat[place] but has no documented birth country, so the [place] used data from [place] patients to calculate their risk score. Document a birth country in the Demographics activity for a more accurate score.         Last calculated by Claretha Cooper, CMA on 01/17/2023 at  8:57 AM        A: BCCCP exam without pap smear Complaint of right breast swelling and pain.  P: Referred patient to the [facility] for a diagnostic mammogram. Appointment scheduled Tuesday, January 17, 2023 at [place].  Loletta Parish, RN 01/17/2023 9:01 AM

## 2023-01-17 NOTE — Patient Instructions (Signed)
Explained breast self awareness with Paul Half. Patient did not need a Pap smear today due to last Pap smear and HPV typing was 12/28/2021. Let her know BCCCP will cover Pap smears and HPV typing every 5 years unless has a history of abnormal Pap smears. Referred patient to the [place] for a diagnostic mammogram. Appointment scheduled Tuesday, January 17, 2023 at [place]. Patient aware of appointment and will be there. Paul Half verbalized understanding.  Ilay Capshaw, Arvil Ch[place], RN 9:02 AM

## 2023-03-30 ENCOUNTER — Ambulatory Visit: Payer: No Typology Code available for payment source | Attending: Radiation Oncology | Admitting: Rehabilitation

## 2023-03-30 ENCOUNTER — Encounter: Payer: Self-pay | Admitting: Rehabilitation

## 2023-03-30 DIAGNOSIS — Z170 Estrogen receptor positive status [ER+]: Secondary | ICD-10-CM | POA: Insufficient documentation

## 2023-03-30 DIAGNOSIS — Z483 Aftercare following surgery for neoplasm: Secondary | ICD-10-CM | POA: Insufficient documentation

## 2023-03-30 DIAGNOSIS — C50211 Malignant neoplasm of upper-inner quadrant of right female breast: Secondary | ICD-10-CM | POA: Insufficient documentation

## 2023-03-30 NOTE — Therapy (Signed)
  OUTPATIENT PHYSICAL THERAPY SOZO SCREENING NOTE   Patient Name: Debra Hobbs MRN: [id number] DOB:02/04/1976, 47 y.o., female Today's Date: 03/30/2023  PCP: Oneita Hurt, No REFERRING PROVIDER: Lonie Peak, MD   PT End of Session - 03/30/23 1707     Visit Number 9   screen only   PT Start Time 1650    PT Stop Time 1700    PT Time Calculation (min) 10 min    Activity Tolerance Patient tolerated treatment well    Behavior During Therapy [facility] for tasks assessed/performed             Past Medical History:  Diagnosis Date   Breast cancer    Cancer    Breast   Family history of uterine cancer    Personal history of radiation therapy    Past Surgical History:  Procedure Laterality Date   BREAST BIOPSY Right 01/20/2022   BREAST LUMPECTOMY     BREAST LUMPECTOMY WITH RADIOACTIVE SEED AND SENTINEL LYMPH NODE BIOPSY Right 04/20/2022   Procedure: RIGHT BREAST LUMPECTOMY WITH RADIOACTIVE SEED AND SENTINEL LYMPH NODE BIOPSY;  Surgeon: Abigail Miyamoto, MD;  Location: [facility];  Service: General;  Laterality: Right;   Patient Active Problem List   Diagnosis Date Noted   Genetic testing 03/03/2022   Family history of uterine cancer 02/23/2022   Malignant neoplasm of upper-inner quadrant of right breast in female, estrogen receptor positive 01/26/2022    REFERRING DIAG: right breast cancer at risk for lymphedema  THERAPY DIAG:  Aftercare following surgery for neoplasm  Malignant neoplasm of upper-inner quadrant of right breast in female, estrogen receptor positive  PERTINENT HISTORY: Patient was diagnosed on 01/20/22 with right grade 3. It measures 3.3 cm. It is ER/PR positive 90%, HER2- with a Ki67 of 1%. She had lumpectomy with 1/11 nodes removed on 04/20/2022.  Completed radiation.     PRECAUTIONS: right UE Lymphedema risk  SUBJECTIVE: nothing new  PAIN:  Are you having pain? Not currently - yes with work only   SOZO SCREENING: Patient was assessed today  using the SOZO machine to determine the lymphedema index score. This was compared to her baseline score. It was determined that she is within the recommended range when compared to her baseline and no further action is needed at this time. She will continue SOZO screenings. These are done every 3 months for 2 years post operatively followed by every 6 months for 2 years, and then annually.  PLAN:  Continue SOZO every 3 months until at least 04/20/24.  Pt already has a sleeve that she wears at work.   Idamae Lusher, PT 03/30/2023, 5:09 PM   Performed by Guy Sandifer

## 2023-05-22 ENCOUNTER — Inpatient Hospital Stay: Payer: Self-pay | Attending: Hematology and Oncology | Admitting: Hematology and Oncology

## 2023-05-22 NOTE — Assessment & Plan Note (Deleted)
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

## 2023-06-03 IMAGING — CT CT ABDOMEN WO/W CM
3 of 15 series · 10 of 46 positions shown, 16 images · IV contrast (APPLIED)
Comparison: None Available.

CLINICAL DATA: Right breast cancer, evaluate liver lesion
incidentally identified by radiation therapy planning CT * Tracking
Code: BO *

EXAM:
CT ABDOMEN WITHOUT AND WITH CONTRAST
TECHNIQUE: Multidetector CT imaging of the abdomen was performed following the
standard protocol before and following the bolus administration of
intravenous contrast.

[Series 5: axial venous · axial · portal-venous · 0.84mm/px · z∈[+1162,+1300]mm · 4 of 78 slices shown, 9 images]
[im 16/78  soft-tissue]
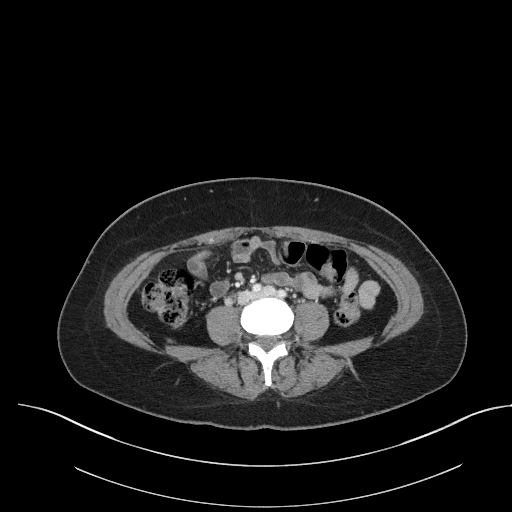
[im 16/78  lung]
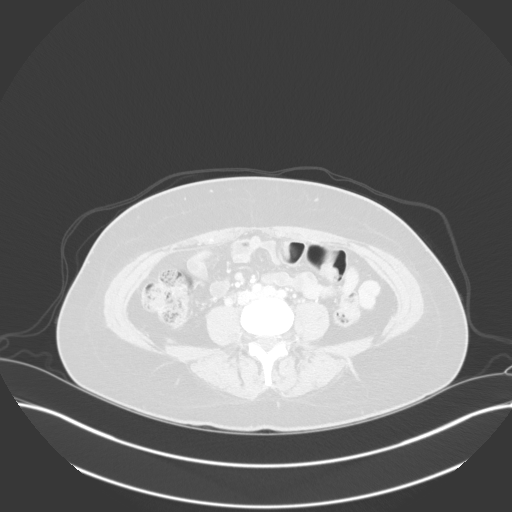
[im 16/78  bone]
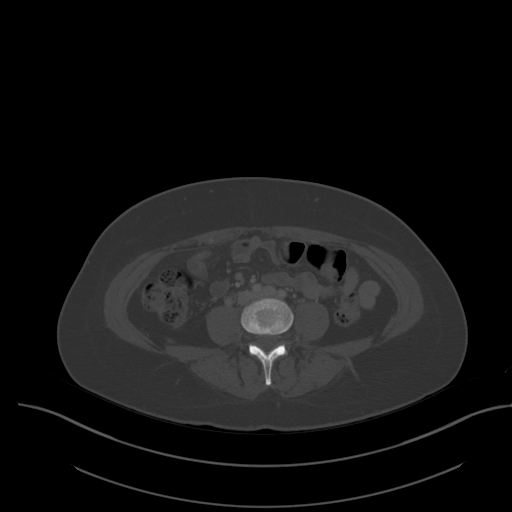
[im 31/78  soft-tissue]
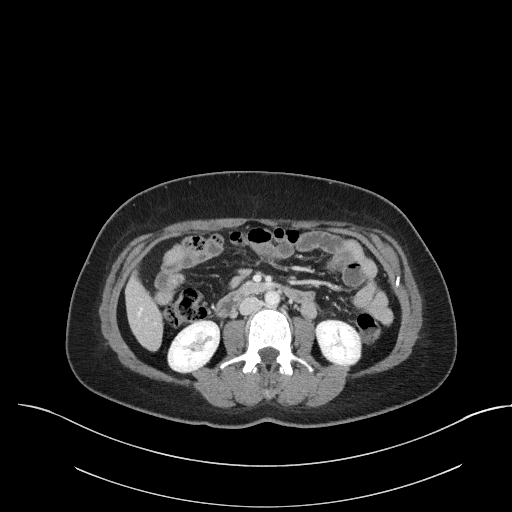
[im 31/78  lung]
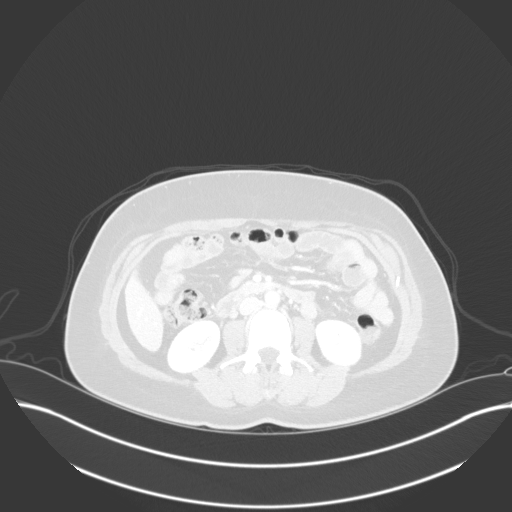
[im 47/78  soft-tissue]
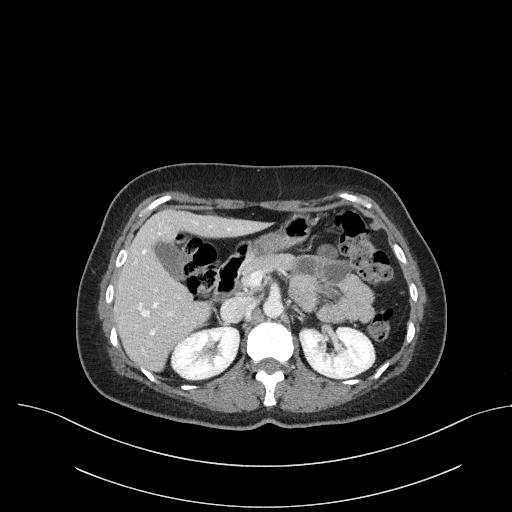
[im 47/78  lung]
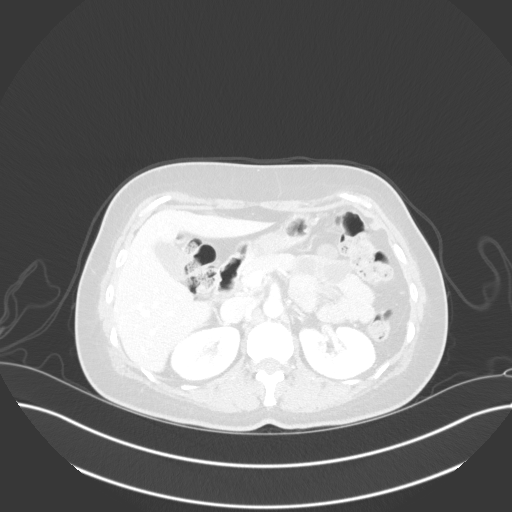
[im 62/78  soft-tissue]
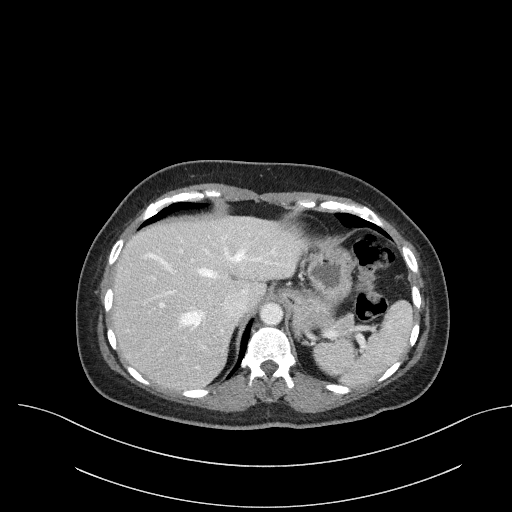
[im 62/78  lung]
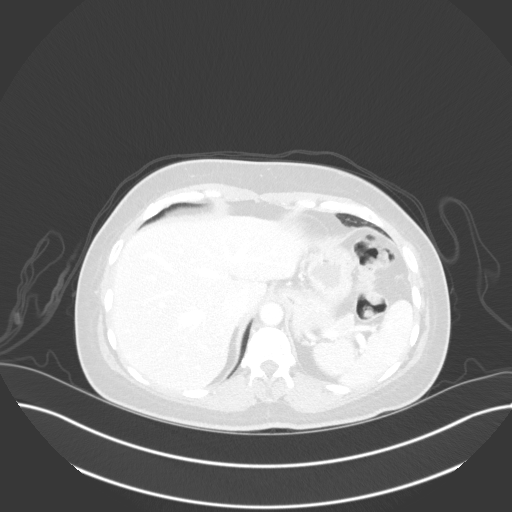

[Series 7: coronal without · coronal · non-contrast · 0.53mm/px · 2 of 89 slices shown, 3 images]
[im 30/89  soft-tissue]
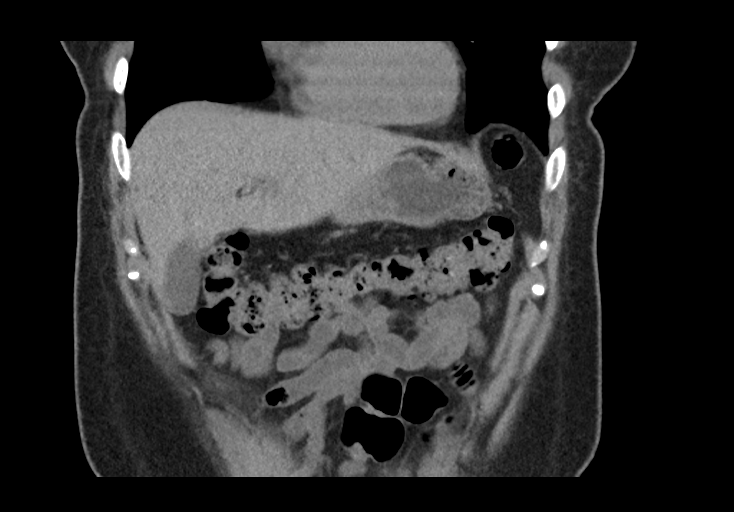
[im 30/89  bone]
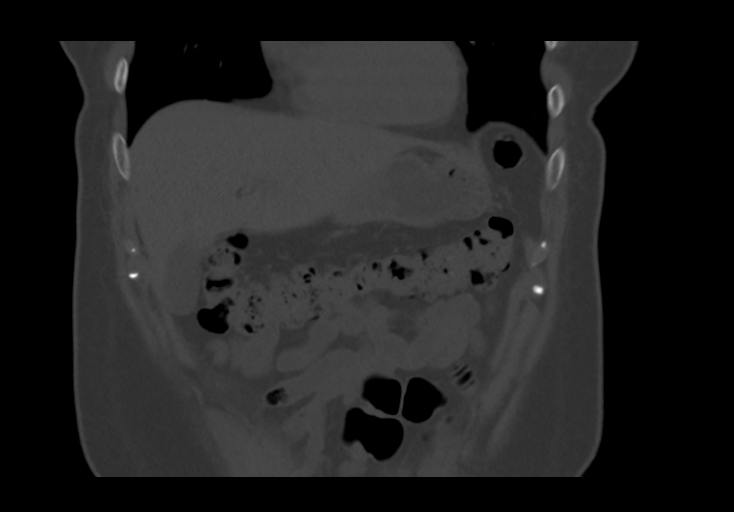
[im 59/89  soft-tissue]
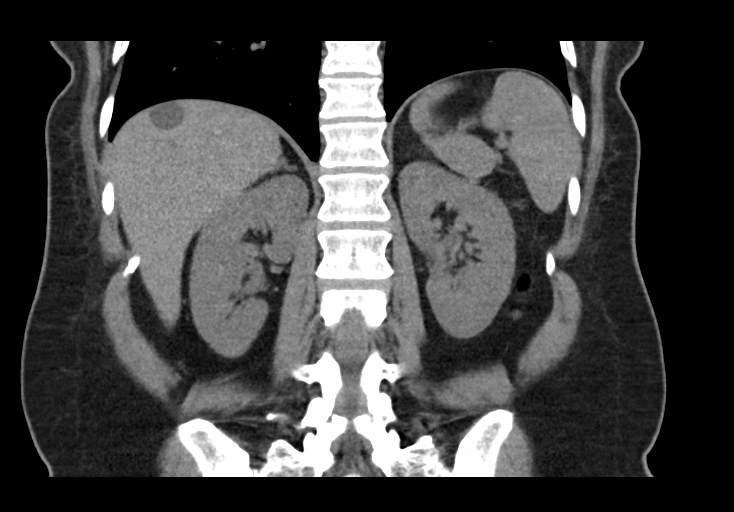

[Series 9: axial arterial · axial · arterial · 0.83mm/px · z∈[+1161,+1302]mm · 4 of 79 slices shown]
[im 16/79  soft-tissue]
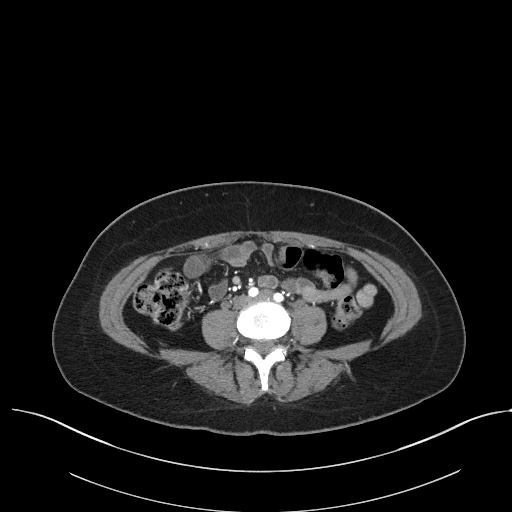
[im 32/79  soft-tissue]
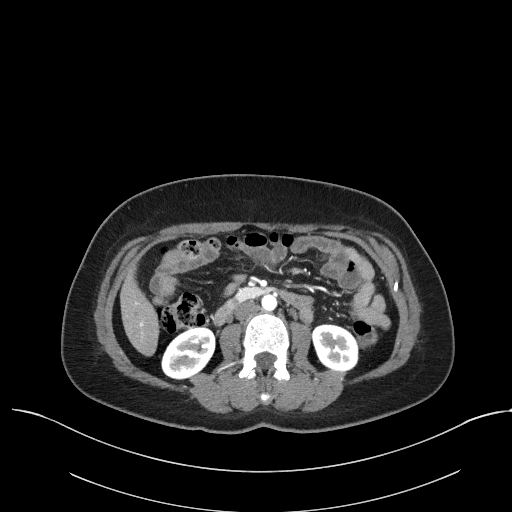
[im 47/79  soft-tissue]
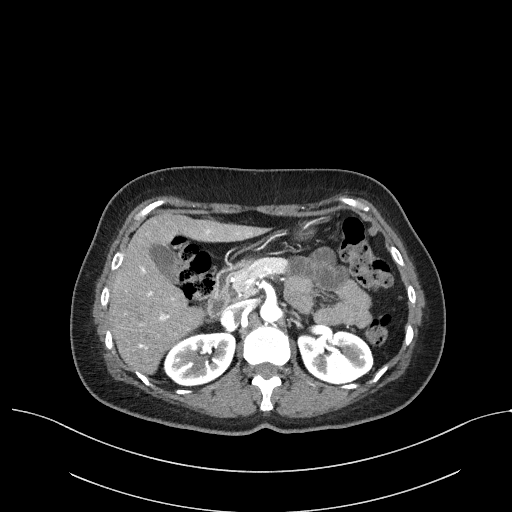
[im 63/79  soft-tissue]
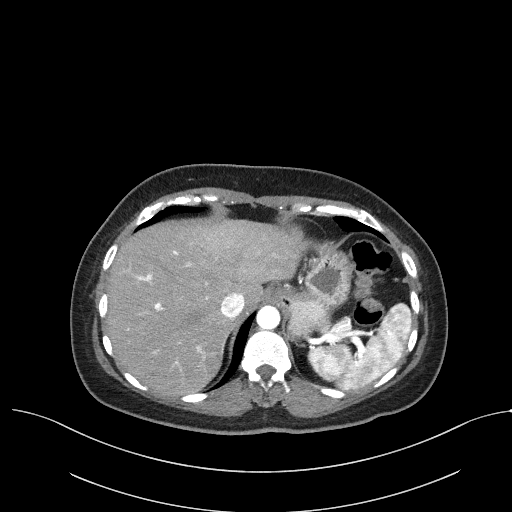

[10 of 46 positions shown; findings below may reference images not displayed]

RADIATION DOSE REDUCTION: This exam was performed according to the
departmental dose-optimization program which includes automated
exposure control, adjustment of the mA and/or kV according to
patient size and/or use of iterative reconstruction technique.

CONTRAST:  100mL OMNIPAQUE IOHEXOL 300 MG/ML  SOLN
FINDINGS: Lower chest: No acute abnormality.

Hepatobiliary: Simple, benign, nonenhancing fluid attenuation
subcapsular cyst of the posterior liver dome, hepatic segment VIII 7
measuring 2.3 x 1.7 cm (series 5, image 10). Additional
subcentimeter lesions of the left lobe of the liver, hepatic segment
II, too small to characterize although most likely additional small
cysts (series 5, image 16, 18). No solid liver abnormality is seen.
No gallstones, gallbladder wall thickening, or biliary dilatation.

Pancreas: Unremarkable. No pancreatic ductal dilatation or
surrounding inflammatory changes.

Spleen: Normal in size without significant abnormality.

Adrenals/Urinary Tract: Adrenal glands are unremarkable. Kidneys are
normal, without renal calculi, solid lesion, or hydronephrosis.

Stomach/Bowel: Stomach is within normal limits. No evidence of bowel
wall thickening, distention, or inflammatory changes.

Vascular/Lymphatic: No significant vascular findings are present.
Incidental note of vascular variant circumaortic left renal vein. No
enlarged abdominal lymph nodes.

Other: No abdominal wall hernia or abnormality. No ascites.

Musculoskeletal: No acute or significant osseous findings.
IMPRESSION: 1. Simple, benign, nonenhancing fluid attenuation subcapsular cyst
of the posterior liver dome, hepatic segment VII. Additional
subcentimeter low-attenuation lesions of the left lobe of the liver,
hepatic segment II, too small to characterize although most likely
additional small cysts. No mass or suspicious contrast enhancement.

2. No lymphadenopathy or definite evidence of metastatic disease in
the abdomen.

## 2023-06-29 ENCOUNTER — Ambulatory Visit: Payer: No Typology Code available for payment source | Attending: Radiation Oncology | Admitting: Rehabilitation

## 2023-06-29 DIAGNOSIS — Z483 Aftercare following surgery for neoplasm: Secondary | ICD-10-CM | POA: Insufficient documentation

## 2023-06-29 DIAGNOSIS — C50211 Malignant neoplasm of upper-inner quadrant of right female breast: Secondary | ICD-10-CM | POA: Insufficient documentation

## 2023-06-29 DIAGNOSIS — Z170 Estrogen receptor positive status [ER+]: Secondary | ICD-10-CM | POA: Insufficient documentation

## 2023-07-05 ENCOUNTER — Telehealth: Payer: Self-pay | Admitting: Hematology and Oncology

## 2023-07-05 ENCOUNTER — Other Ambulatory Visit: Payer: Self-pay | Admitting: Hematology and Oncology

## 2023-07-05 NOTE — Telephone Encounter (Signed)
Left patient a message regarding upcoming appointment times/dates

## 2023-08-01 ENCOUNTER — Other Ambulatory Visit: Payer: Self-pay

## 2023-08-01 ENCOUNTER — Inpatient Hospital Stay: Payer: Self-pay | Attending: Hematology and Oncology | Admitting: Hematology and Oncology

## 2023-08-01 VITALS — BP 112/66 | HR 69 | Temp 97.9°F | Resp 16 | Wt 123.5 lb

## 2023-08-01 DIAGNOSIS — Z79810 Long term (current) use of selective estrogen receptor modulators (SERMs): Secondary | ICD-10-CM | POA: Insufficient documentation

## 2023-08-01 DIAGNOSIS — Z170 Estrogen receptor positive status [ER+]: Secondary | ICD-10-CM

## 2023-08-01 DIAGNOSIS — Z923 Personal history of irradiation: Secondary | ICD-10-CM | POA: Insufficient documentation

## 2023-08-01 DIAGNOSIS — C50211 Malignant neoplasm of upper-inner quadrant of right female breast: Secondary | ICD-10-CM | POA: Insufficient documentation

## 2023-08-01 NOTE — Progress Notes (Signed)
Patient Care Team: Pcp, No as PCP - General Lonie Peak, MD as Attending Physician (Radiation Oncology) Abigail Miyamoto, MD as Consulting Physician (General Surgery) Serena Croissant, MD as Consulting Physician (Hematology and Oncology) Axel Filler Larna Daughters, NP as Nurse Practitioner (Hematology and Oncology)  DIAGNOSIS:  Encounter Diagnosis  Name Primary?   Malignant neoplasm of upper-inner quadrant of right breast in female, estrogen receptor positive (HCC) Yes    SUMMARY OF ONCOLOGIC HISTORY: Oncology History  Malignant neoplasm of upper-inner quadrant of right breast in female, estrogen receptor positive (HCC)  01/20/2022 Initial Diagnosis   Progressive nipple inversion for 1 year, mammogram 01/13/2022: Retroareolar mass 3.3 cm: Biopsy: Invasive lobular cancer with LCIS grade 2, ER 90%, PR 90%, Ki-67 1%, HER2 negative ratio 1.24   01/26/2022 Cancer Staging   Staging form: Breast, AJCC 8th Edition - Clinical: Stage IB (cT2, cN0, cM0, G2, ER+, PR+, HER2-) - Signed by Loa Socks, NP on 01/26/2022 Histologic grading system: 3 grade system   03/09/2022 Genetic Testing   Negative genetic testing on the BRCAPlus panel.  The report date is March 01, 2022.  MUTYH, NF2 and TSC2 VUS's identified on the CancerNext-Expanded+RNAinsight panel.  These will not change medical management.  The report date is March 09, 2022.  The CancerNext-Expanded gene panel offered by [facility] and includes sequencing and rearrangement analysis for the following 77 genes: AIP, ALK, APC*, ATM*, AXIN2, BAP1, BARD1, BLM, BMPR1A, BRCA1*, BRCA2*, BRIP1*, CDC73, CDH1*, CDK4, CDKN1B, CDKN2A, CHEK2*, CTNNA1, DICER1, FANCC, FH, FLCN, GALNT12, KIF1B, LZTR1, MAX, MEN1, MET, MLH1*, MSH2*, MSH3, MSH6*, MUTYH*, NBN, NF1*, NF2, NTHL1, PALB2*, PHOX2B, PMS2*, POT1, PRKAR1A, PTCH1, PTEN*, RAD51C*, RAD51D*, RB1, RECQL, RET, SDHA, SDHAF2, SDHB, SDHC, SDHD, SMAD4, SMARCA4, SMARCB1, SMARCE1, STK11, SUFU, TMEM127, TP53*,  TSC1, TSC2, VHL and XRCC2 (sequencing and deletion/duplication); EGFR, EGLN1, HOXB13, KIT, MITF, PDGFRA, POLD1, and POLE (sequencing only); EPCAM and GREM1 (deletion/duplication only). DNA and RNA analyses performed for * genes.    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, extends into the dermis of the nipple, margins negative, 1/11 lymph nodes positive ER 90%, PR 90%, HER2 negative, Ki-67 1%   05/02/2022 Cancer Staging   Staging form: Breast, AJCC 8th Edition - Pathologic: Stage IB (pT2, pN1, cM0, G2, ER+, PR+, HER2-) - Signed by Serena Croissant, MD on 05/02/2022 Stage prefix: Initial diagnosis Histologic grading system: 3 grade system   05/25/2022 - 07/06/2022 Radiation Therapy   Site Technique Total Dose (Gy) Dose per Fx (Gy) Completed Fx Beam Energies  Breast, Right: Breast_R_IMN 3D 50/50 2 25/25 6XFFF  Breast, Right: Breast_R_PAB_SCV 3D 50/50 2 25/25 6X, 10X  Breast, Right: Breast_R_Bst specialPort 10/10 2 5/5 9E, 12E     07/19/2022 -  Anti-estrogen oral therapy   20 mg Tamoxifen x 5-10 years     CHIEF COMPLIANT: Follow-up tamoxifen  INTERVAL HISTORY: Debra Hobbs is a 47 y.o. female is here because of recent diagnosis of invasive mammary carcinoma of the right breast. She presents to the clinic today for a follow-up. Patient reports that she feels like she is having some depression. She says she has more fatigue. She feels like she can manage it. Says she does have some pain on the right side breast and around the scar tissue.   ALLERGIES:  has No Known Allergies.  MEDICATIONS:  Current Outpatient Medications  Medication Sig Dispense Refill   acetaminophen (TYLENOL) 500 MG tablet Take 500 mg by mouth every 6 (six) hours as needed.  tamoxifen (NOLVADEX) 20 MG tablet TAKE 1 TABLET BY MOUTH EVERY DAY 90 tablet 3   No current facility-administered medications for this visit.    PHYSICAL EXAMINATION: ECOG PERFORMANCE STATUS: 1 - Symptomatic but  completely ambulatory  Vitals:   08/01/23 1005  BP: 112/66  Pulse: 69  Resp: 16  Temp: 97.9 F (36.6 C)  SpO2: 100%   Filed Weights   08/01/23 1005  Weight: 123 lb 8 oz (56 kg)    BREAST: No palpable masses or nodules in either right or left breasts. No palpable axillary supraclavicular or infraclavicular adenopathy no breast tenderness or nipple discharge. 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  I instructed her to take tamoxifen at different times of the day and see if that makes a difference.  If that does not make a difference then we can reduce the dosage.  Breast cancer surveillance: Breast exam 08/01/2023: Benign Mammogram 01/19/2023: Benign breast density category C  Return to clinic in 1 year for follow-up    No orders of the defined types were placed in this encounter.  The patient has a good understanding of the overall plan. she agrees with it. she will call with any problems that may develop before the next visit here. Total time spent: 30 mins including face to face time and time spent  for planning, charting and co-ordination of care   Tamsen Meek, MD 08/01/23    I Janan Ridge am acting as a Neurosurgeon for [employer]  I have reviewed the above documentation for accuracy and completeness, and I agree with the above.

## 2023-08-01 NOTE — Assessment & Plan Note (Addendum)
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  I instructed her to take tamoxifen at different times of the day and see if that makes a difference.  If that does not make a difference then we can reduce the dosage.  Breast cancer surveillance: Breast exam 08/01/2023: Benign Mammogram 01/19/2023: Benign breast density category C  Return to clinic in 1 year for follow-up

## 2023-08-02 NOTE — Therapy (Signed)
  OUTPATIENT PHYSICAL THERAPY SOZO SCREENING NOTE   Patient Name: Debra Hobbs MRN: [id number] DOB:[birth year], 47 y.o., female Today's Date: 08/02/2023  PCP: Oneita Hurt, No REFERRING PROVIDER: Lonie Peak, MD    Past Medical History:  Diagnosis Date   Breast cancer (HCC)    Cancer (HCC)    Breast   Family history of uterine cancer    Personal history of radiation therapy    Past Surgical History:  Procedure Laterality Date   BREAST BIOPSY Right 01/20/2022   BREAST LUMPECTOMY     BREAST LUMPECTOMY WITH RADIOACTIVE SEED AND SENTINEL LYMPH NODE BIOPSY Right 04/20/2022   Procedure: RIGHT BREAST LUMPECTOMY WITH RADIOACTIVE SEED AND SENTINEL LYMPH NODE BIOPSY;  Surgeon: Abigail Miyamoto, MD;  Location: [facility];  Service: General;  Laterality: Right;   Patient Active Problem List   Diagnosis Date Noted   Genetic testing 03/03/2022   Family history of uterine cancer 02/23/2022   Malignant neoplasm of upper-inner quadrant of right breast in female, estrogen receptor positive (HCC) 01/26/2022    REFERRING DIAG: right breast cancer at risk for lymphedema  THERAPY DIAG:  No diagnosis found.  PERTINENT HISTORY:   Patient was diagnosed on 01/20/22 with right grade 3. It measures 3.3 cm. It is ER/PR positive 90%, HER2- with a Ki67 of 1%. She had lumpectomy with 1/11 nodes removed on 04/20/2022.  Completed radiation   PRECAUTIONS: right UE Lymphedema risk,   SUBJECTIVE: ***  PAIN:  Are you having pain? {[id number]}  SOZO SCREENING: Patient was assessed today using the SOZO machine to determine the lymphedema index score. This was compared to her baseline score. It was determined that she is within the recommended range when compared to her baseline and no further action is needed at this time. She will continue SOZO screenings. These are done every 3 months for 2 years post operatively followed by every 6 months for 2 years, and then annually.  Patient was  assessed today using the SOZO machine to determine the lymphedema index score. This was compared to her baseline score. It was determined that she is NOT within the recommended range when compared to her baseline and so she was fitted for a compression garment while in the clinic today. It is recommended she return in 1 month to be reassessed. If she continues to measure outside the recommended range, physical therapy treatment will be recommended at that time and a referral requested.  PLAN:  Continue SOZO every 3 months until at least 04/20/24.  Pt already has a sleeve that she wears at work.   Waynette Buttery, PT 08/02/2023, 7:11 AM

## 2023-08-03 ENCOUNTER — Ambulatory Visit: Payer: No Typology Code available for payment source | Attending: Radiation Oncology

## 2023-08-03 DIAGNOSIS — C50211 Malignant neoplasm of upper-inner quadrant of right female breast: Secondary | ICD-10-CM | POA: Insufficient documentation

## 2023-08-03 DIAGNOSIS — Z170 Estrogen receptor positive status [ER+]: Secondary | ICD-10-CM | POA: Insufficient documentation

## 2023-08-03 DIAGNOSIS — Z483 Aftercare following surgery for neoplasm: Secondary | ICD-10-CM | POA: Insufficient documentation

## 2023-10-27 ENCOUNTER — Ambulatory Visit: Payer: No Typology Code available for payment source | Admitting: Rehabilitation

## 2023-10-30 ENCOUNTER — Ambulatory Visit: Payer: No Typology Code available for payment source

## 2023-11-03 ENCOUNTER — Ambulatory Visit: Payer: No Typology Code available for payment source | Attending: Radiation Oncology | Admitting: Rehabilitation

## 2023-11-03 DIAGNOSIS — Z483 Aftercare following surgery for neoplasm: Secondary | ICD-10-CM | POA: Insufficient documentation

## 2023-11-03 DIAGNOSIS — Z170 Estrogen receptor positive status [ER+]: Secondary | ICD-10-CM | POA: Insufficient documentation

## 2023-11-03 DIAGNOSIS — C50211 Malignant neoplasm of upper-inner quadrant of right female breast: Secondary | ICD-10-CM | POA: Insufficient documentation

## 2023-11-03 NOTE — Therapy (Signed)
  OUTPATIENT PHYSICAL THERAPY SOZO SCREENING NOTE   Patient Name: Debra Hobbs MRN: [id number] DOB:09-09-1976, 47 y.o., female Today's Date: 11/03/2023  PCP: Oneita Hurt, No REFERRING PROVIDER: Lonie Peak, MD    Past Medical History:  Diagnosis Date   Breast cancer (HCC)    Cancer (HCC)    Breast   Family history of uterine cancer    Personal history of radiation therapy    Past Surgical History:  Procedure Laterality Date   BREAST BIOPSY Right 01/20/2022   BREAST LUMPECTOMY     BREAST LUMPECTOMY WITH RADIOACTIVE SEED AND SENTINEL LYMPH NODE BIOPSY Right 04/20/2022   Procedure: RIGHT BREAST LUMPECTOMY WITH RADIOACTIVE SEED AND SENTINEL LYMPH NODE BIOPSY;  Surgeon: Abigail Miyamoto, MD;  Location: [facility];  Service: General;  Laterality: Right;   Patient Active Problem List   Diagnosis Date Noted   Genetic testing 03/03/2022   Family history of uterine cancer 02/23/2022   Malignant neoplasm of upper-inner quadrant of right breast in female, estrogen receptor positive (HCC) 01/26/2022    REFERRING DIAG: right breast cancer at risk for lymphedema  THERAPY DIAG:  No diagnosis found.  PERTINENT HISTORY:   Patient was diagnosed on 01/20/22 with right grade 3. It measures 3.3 cm. It is ER/PR positive 90%, HER2- with a Ki67 of 1%. She had lumpectomy with 1/11 nodes removed on 04/20/2022.  Completed radiation   PRECAUTIONS: right UE Lymphedema risk,   SUBJECTIVE:   PAIN:  Are you having pain?   SOZO SCREENING: Patient was assessed today using the SOZO machine to determine the lymphedema index score. This was compared to her baseline score. It was determined that she is within the recommended range when compared to her baseline and no further action is needed at this time. She will continue SOZO screenings. These are done every 3 months for 2 years post operatively followed by every 6 months for 2 years, and then annually.   PLAN:  Continue SOZO every 3  months until at least 04/20/24.  Pt already has a sleeve that she wears at work.   Idamae Lusher, PT 11/03/2023, 9:58 AM

## 2024-01-15 ENCOUNTER — Other Ambulatory Visit (HOSPITAL_COMMUNITY): Payer: Self-pay | Admitting: Obstetrics and Gynecology

## 2024-01-15 DIAGNOSIS — C50919 Malignant neoplasm of unspecified site of unspecified female breast: Secondary | ICD-10-CM

## 2024-01-15 DIAGNOSIS — Z853 Personal history of malignant neoplasm of breast: Secondary | ICD-10-CM

## 2024-02-02 ENCOUNTER — Other Ambulatory Visit: Payer: Self-pay

## 2024-02-02 ENCOUNTER — Ambulatory Visit: Payer: Self-pay | Attending: Radiation Oncology | Admitting: Rehabilitation

## 2024-02-02 ENCOUNTER — Encounter: Payer: Self-pay | Admitting: Rehabilitation

## 2024-02-02 ENCOUNTER — Inpatient Hospital Stay: Payer: Self-pay | Attending: Obstetrics and Gynecology | Admitting: *Deleted

## 2024-02-02 VITALS — BP 103/65 | Wt 123.1 lb

## 2024-02-02 DIAGNOSIS — C50211 Malignant neoplasm of upper-inner quadrant of right female breast: Secondary | ICD-10-CM | POA: Insufficient documentation

## 2024-02-02 DIAGNOSIS — Z1211 Encounter for screening for malignant neoplasm of colon: Secondary | ICD-10-CM

## 2024-02-02 DIAGNOSIS — N644 Mastodynia: Secondary | ICD-10-CM

## 2024-02-02 DIAGNOSIS — Z1239 Encounter for other screening for malignant neoplasm of breast: Secondary | ICD-10-CM

## 2024-02-02 DIAGNOSIS — Z170 Estrogen receptor positive status [ER+]: Secondary | ICD-10-CM | POA: Insufficient documentation

## 2024-02-02 DIAGNOSIS — Z483 Aftercare following surgery for neoplasm: Secondary | ICD-10-CM | POA: Insufficient documentation

## 2024-02-02 NOTE — Patient Instructions (Signed)
Explained breast self awareness with Debra Hobbs. Patient did not need a Pap smear today due to last Pap smear and HPV typing was 12/28/2021. Let her know BCCCP will cover Pap smears and HPV typing every 5 years unless has a history of abnormal Pap smears. Referred patient to [facility] for a diagnostic mammogram. Appointment scheduled Tuesday, March 05, 2024 at 1440. Patient aware of appointment and will be there. Debra Hobbs verbalized understanding.  Jules Vidovich, Kathaleen Maser, RN 11:42 AM

## 2024-02-02 NOTE — Progress Notes (Signed)
[name] is a 48 y.o. female who presents to [facility] clinic today with complaint of right outer breast swelling and pain. Patient states the pain comes and goes. Patient rates the pain at a 2 out of 10. Per previous exam 01/17/2023 patient complained of right breast swelling and pain for over 6 years. Based on previous rating the pain has decreased to a 2 from a 5 out of 10.    Pap Smear: Pap smear not completed today. Last Pap smear was 12/28/2021 at the [facility] clinic and patient stated she has not received the results. Per patient has no history of an abnormal Pap smear. Last Pap smear result is available in Epic.    Physical exam: Breasts Left breast larger than right breast due to patient has a history of right breast lumpectomy due to breast cancer. No skin left breast. Scar observed right axilla and right breast due to history of right breast lumpectomy and lymph node removal for breast cancer. No nipple retraction left breast. Right nipple absent due to history of lumpectomy due to breast cancer. No nipple discharge bilateral breasts. No lymphadenopathy. No lumps palpated bilateral breasts. Complaints of right breast and axillary pain on exam greater within the outer breast and axilla. Complaints of right outer breast pain noted on previous exam 01/17/2023.  MS DIGITAL DIAG TOMO BILAT Addendum Date: 01/19/2023 ADDENDUM REPORT: 01/19/2023 08:48 ADDENDUM: Comparison was made to previous exams including bilateral diagnostic mammogram dated 01/13/2022 and breast MRI dated 02/12/2022. Electronically Signed   By: Bary Richard M.D.   On: 01/19/2023 08:48   Result Date: 01/19/2023 CLINICAL DATA:  History of RIGHT breast cancer in early 2023 status post lumpectomy and radiation therapy. EXAM: DIGITAL DIAGNOSTIC BILATERAL MAMMOGRAM WITH TOMOSYNTHESIS TECHNIQUE: Bilateral digital diagnostic mammography and breast tomosynthesis was performed. COMPARISON:  Previous  exam(s). ACR Breast Density Category c: The breast tissue is heterogeneously dense, which may obscure small masses. FINDINGS: There are expected postsurgical changes of the RIGHT breast. There are no new dominant masses, suspicious calcifications or secondary signs of malignancy elsewhere within either breast. IMPRESSION: No evidence of malignancy within either breast. Expected postsurgical changes of the RIGHT breast. RECOMMENDATION: Bilateral diagnostic mammogram in 1 year. I have discussed the findings and recommendations with the patient. If applicable, a reminder letter will be sent to the patient regarding the next appointment. BI-RADS CATEGORY  2: Benign. Electronically Signed: By: Bary Richard M.D. On: 01/17/2023 13:13   MM Breast Surgical Specimen Result Date: 04/20/2022 CLINICAL DATA:  Post lumpectomy specimen radiograph EXAM: SPECIMEN RADIOGRAPH OF THE RIGHT BREAST COMPARISON:  None Available. FINDINGS: Status post excision of the right breast. The radioactive seed and biopsy marker clip are present and completely intact. IMPRESSION: Specimen radiograph of the right breast. Electronically Signed   By: Emmaline Kluver M.D.   On: 04/20/2022 11:25  MM RT RADIOACTIVE SEED LOC MAMMO GUIDE Result Date: 04/19/2022 CLINICAL DATA:  48 year old female with newly diagnosed invasive mammary carcinoma of the right breast present in for seed localization. EXAM: MAMMOGRAPHIC GUIDED RADIOACTIVE SEED LOCALIZATION OF THE RIGHT BREAST COMPARISON:  None Available. FINDINGS: Patient presents for radioactive seed localization prior to . I met with the patient and we discussed the procedure of seed localization including benefits and alternatives. We discussed the high likelihood of a successful procedure. We discussed the risks of the procedure including infection, bleeding, tissue injury and further surgery. We discussed the low dose of radioactivity involved in the procedure. Informed,  written consent was given. The  usual time-out protocol was performed immediately prior to the procedure. Using mammographic guidance, sterile technique, 1% lidocaine and an I-125 radioactive seed, the ribbon biopsy marking clip was localized using a medial approach. The follow-up mammogram images confirm the seed in the expected location and were marked for Dr. Magnus Ivan. Follow-up survey of the patient confirms presence of the radioactive seed. Order number of I-125 seed:  [id number]. Total activity: 0.251 mCi reference Date: March 09, 2022 The patient tolerated the procedure well and was [facility] from the [facility]. She was given instructions regarding seed removal. IMPRESSION: Radioactive seed localization right breast. No apparent complications. Electronically Signed   By: Emmaline Kluver M.D.   On: 04/19/2022 14:25  MM CLIP PLACEMENT RIGHT Result Date: 01/20/2022 CLINICAL DATA:  Status post ultrasound-guided core biopsy of RIGHT breast mass. EXAM: 3D DIAGNOSTIC RIGHT MAMMOGRAM POST ULTRASOUND BIOPSY COMPARISON:  Previous exam(s). FINDINGS: 3D Mammographic images were obtained following ultrasound guided biopsy of mass in the 1 o'clock retroareolar region of the RIGHT breast and placement of a ribbon shaped. The biopsy marking clip is in expected position at the site of biopsy. IMPRESSION: Appropriate positioning of the ribbon shaped biopsy marking clip at the site of biopsy in the retroareolar RIGHT breast. Final Assessment: Post Procedure Mammograms for Marker Placement Electronically Signed   By: Norva Pavlov M.D.   On: 01/20/2022 08:30  MS DIGITAL DIAG TOMO BILAT Result Date: 01/13/2022 CLINICAL DATA:  48 year old female presenting for evaluation of progressive right nipple inversion over the past year. This is the patient's baseline exam. EXAM: DIGITAL DIAGNOSTIC BILATERAL MAMMOGRAM WITH TOMOSYNTHESIS AND CAD; ULTRASOUND RIGHT BREAST LIMITED TECHNIQUE: Bilateral digital diagnostic mammography and breast tomosynthesis was  performed. The images were evaluated with computer-aided detection.; Targeted ultrasound examination of the right breast was performed COMPARISON:  None. ACR Breast Density Category d: The breast tissue is extremely dense, which lowers the sensitivity of mammography. FINDINGS: Pronounced distortion is noted in the anterior retroareolar right breast. No other suspicious calcifications, masses or areas of distortion are seen in the bilateral breasts. Physical exam of the right breast demonstrates asymmetric partial retraction of the right nipple. Ultrasound targeted to the retroareolar right breast demonstrates an ill-defined hypoechoic area with sonographic distortion between 12 and 1 o'clock. The most masslike portion of the abnormality is at 12 o'clock, 2 cm from the nipple, but all together, the abnormality spans approximately 3.3 x 1.8 x 2.9 cm as measured at 1 o'clock, 1 cm from the nipple. Measurement is difficult as the margins are very indistinct. Ultrasound of the right axilla demonstrates multiple normal-appearing lymph nodes. IMPRESSION: 1. There is a highly suspicious mass in the retroareolar right breast between 12 and 1 o'clock measuring at least 3.3 cm, though measurement is difficult due to the indistinct appearance. 2.  No evidence of right axillary lymphadenopathy. RECOMMENDATION: 1. Ultrasound-guided biopsy is recommended for the right breast mass, and has been scheduled for 01/20/2022 at 7:30 a.m. 2. If malignancy is found on pathology, and the patient desires breast conservation, MRI is recommended to determine true extent of disease given the ill-defined appearance of the mass, the patient's breast tissue density and her premenopausal status. I have discussed the findings and recommendations with the patient. If applicable, a reminder letter will be sent to the patient regarding the next appointment. BI-RADS CATEGORY  5: Highly suggestive of malignancy. Electronically Signed   By: Frederico Hamman M.D.   On: 01/13/2022 15:15  Pelvic/Bimanual Pap  is not indicated today per BCCCP guidelines.    Smoking History: Patient has never smoked.   Patient Navigation: Patient education provided. Access to services provided for patient through [facility] program. Spanish interpreter Lynnell Chad from [facility] provided.   Colorectal Cancer Screening: Per patient has never had colonoscopy completed. FIT Test given to patient to complete. No complaints today.    Breast and Cervical Cancer Risk Assessment: Patient does not have family history of breast cancer. Patient has a personal history of breast cancer. Patient has no known genetic mutations or history of radiation treatment to the chest before age 4. Patient does not have history of cervical dysplasia, immunocompromised, or DES exposure in-utero. Breast cancer risk assessment completed. No breast cancer risk calculated due to patient has history of breast cancer.  Risk Assessment   No risk assessment data    A: BCCCP exam without pap smear Complaint of right breast swelling and pain.  P: Referred patient to [facility] for a diagnostic mammogram. Appointment scheduled Tuesday, March 05, 2024 at 1440.  Priscille Heidelberg, RN 02/02/2024 11:42 AM

## 2024-02-02 NOTE — Therapy (Signed)
  OUTPATIENT PHYSICAL THERAPY SOZO SCREENING NOTE   Patient Name: Debra Hobbs MRN: [id number] DOB:11/15/1976, 48 y.o., female Today's Date: 02/02/2024  PCP: Oneita Hurt, No REFERRING PROVIDER: Lonie Peak, MD   PT End of Session - 02/02/24 1004     Visit Number 9   screen only   PT Start Time 1000    PT Stop Time 1005    PT Time Calculation (min) 5 min    Activity Tolerance Patient tolerated treatment well    Behavior During Therapy WFL for tasks assessed/performed             Past Medical History:  Diagnosis Date   Breast cancer (HCC)    Cancer (HCC)    Breast   Family history of uterine cancer    Personal history of radiation therapy    Past Surgical History:  Procedure Laterality Date   BREAST BIOPSY Right 01/20/2022   BREAST LUMPECTOMY     BREAST LUMPECTOMY WITH RADIOACTIVE SEED AND SENTINEL LYMPH NODE BIOPSY Right 04/20/2022   Procedure: RIGHT BREAST LUMPECTOMY WITH RADIOACTIVE SEED AND SENTINEL LYMPH NODE BIOPSY;  Surgeon: Abigail Miyamoto, MD;  Location: [facility];  Service: General;  Laterality: Right;   Patient Active Problem List   Diagnosis Date Noted   Genetic testing 03/03/2022   Family history of uterine cancer 02/23/2022   Malignant neoplasm of upper-inner quadrant of right breast in female, estrogen receptor positive (HCC) 01/26/2022    REFERRING DIAG: right breast cancer at risk for lymphedema  THERAPY DIAG:  Aftercare following surgery for neoplasm  Malignant neoplasm of upper-inner quadrant of right breast in female, estrogen receptor positive (HCC)  PERTINENT HISTORY:   Patient was diagnosed on 01/20/22 with right grade 3. It measures 3.3 cm. It is ER/PR positive 90%, HER2- with a Ki67 of 1%. She had lumpectomy with 1/11 nodes removed on 04/20/2022.  Completed radiation   PRECAUTIONS: right UE Lymphedema risk,   SUBJECTIVE:   PAIN:  Are you having pain?   SOZO SCREENING: Patient was assessed today using the SOZO  machine to determine the lymphedema index score. This was compared to her baseline score. It was determined that she is within the recommended range when compared to her baseline and no further action is needed at this time. She will continue SOZO screenings. These are done every 3 months for 2 years post operatively followed by every 6 months for 2 years, and then annually.   PLAN:  Continue SOZO every 3 months until at least 04/20/24.  Pt already has a sleeve that she wears at work.   Idamae Lusher, PT 02/02/2024, 10:05 AM

## 2024-03-05 ENCOUNTER — Ambulatory Visit (HOSPITAL_COMMUNITY)
Admission: RE | Admit: 2024-03-05 | Discharge: 2024-03-05 | Disposition: A | Discharge status: Home / Self Care | Payer: Self-pay | Source: Ambulatory Visit | Attending: Obstetrics and Gynecology | Admitting: Obstetrics and Gynecology

## 2024-03-05 DIAGNOSIS — C50919 Malignant neoplasm of unspecified site of unspecified female breast: Secondary | ICD-10-CM | POA: Insufficient documentation

## 2024-03-05 DIAGNOSIS — Z853 Personal history of malignant neoplasm of breast: Secondary | ICD-10-CM

## 2024-04-12 LAB — FECAL OCCULT BLOOD, IMMUNOCHEMICAL: Fecal Occult Bld: NEGATIVE

## 2024-05-10 ENCOUNTER — Ambulatory Visit: Payer: Self-pay | Attending: Radiation Oncology

## 2024-05-10 VITALS — Wt 125.5 lb

## 2024-05-10 DIAGNOSIS — Z483 Aftercare following surgery for neoplasm: Secondary | ICD-10-CM | POA: Insufficient documentation

## 2024-05-10 NOTE — Therapy (Signed)
  OUTPATIENT PHYSICAL THERAPY SOZO SCREENING NOTE   Patient Name: Debra Hobbs MRN: [id number] DOB:1976-09-21, 48 y.o., female Today's Date: 05/10/2024  PCP: Vicente Graham, No REFERRING PROVIDER: Colie Dawes, MD   PT End of Session - 05/10/24 [phone]     Visit Number 9   # unchanged due to screen only   PT Start Time 0940    PT Stop Time 0944    PT Time Calculation (min) 4 min    Activity Tolerance Patient tolerated treatment well    Behavior During Therapy [facility] for tasks assessed/performed             Past Medical History:  Diagnosis Date   Breast cancer (HCC)    Cancer (HCC)    Breast   Family history of uterine cancer    Personal history of radiation therapy    Past Surgical History:  Procedure Laterality Date   BREAST BIOPSY Right 01/20/2022   BREAST LUMPECTOMY     BREAST LUMPECTOMY WITH RADIOACTIVE SEED AND SENTINEL LYMPH NODE BIOPSY Right 04/20/2022   Procedure: RIGHT BREAST LUMPECTOMY WITH RADIOACTIVE SEED AND SENTINEL LYMPH NODE BIOPSY;  Surgeon: Oza Blumenthal, MD;  Location: [facility];  Service: General;  Laterality: Right;   Patient Active Problem List   Diagnosis Date Noted   Genetic testing 03/03/2022   Family history of uterine cancer 02/23/2022   Malignant neoplasm of upper-inner quadrant of right breast in female, estrogen receptor positive (HCC) 01/26/2022    REFERRING DIAG: right breast cancer at risk for lymphedema  THERAPY DIAG: Aftercare following surgery for neoplasm  PERTINENT HISTORY:   Patient was diagnosed on 01/20/22 with right grade 3. It measures 3.3 cm. It is ER/PR positive 90%, HER2- with a Ki67 of 1%. She had lumpectomy with 1/11 nodes removed on 04/20/2022.  Completed radiation   PRECAUTIONS: right UE Lymphedema risk,   SUBJECTIVE: Pt here for last 3 month L-Dex screen.   PAIN:  Are you having pain? No  SOZO SCREENING: Patient was assessed today using the SOZO machine to determine the lymphedema index score. This  was compared to her baseline score. It was determined that she is within the recommended range when compared to her baseline and no further action is needed at this time. She will continue SOZO screenings. These are done every 3 months for 2 years post operatively followed by every 6 months for 2 years, and then annually.   L-DEX FLOWSHEETS - 05/10/24 0900       L-DEX LYMPHEDEMA SCREENING   Measurement Type Unilateral    L-DEX MEASUREMENT EXTREMITY Upper Extremity    POSITION  Standing    DOMINANT SIDE Right    At Risk Side Right    BASELINE SCORE (UNILATERAL) -6.8    L-DEX SCORE (UNILATERAL) -3.3    VALUE CHANGE (UNILAT) 3.5              PLAN:  Continue SOZO every 6 months until May 2027. Pt already has a sleeve that she wears at work.   Denyce Flank, PTA 05/10/2024, 9:43 AM

## 2024-07-13 ENCOUNTER — Other Ambulatory Visit: Payer: Self-pay | Admitting: Hematology and Oncology

## 2024-07-30 NOTE — Assessment & Plan Note (Signed)
 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  I instructed her to take tamoxifen  at different times of the day and see if that makes a difference.  If that does not make a difference then we can reduce the dosage.   Breast cancer surveillance: Breast exam 07/31/2024: Benign Mammogram 03/05/24: Benign breast density category C   Return to clinic in 1 year for follow-up

## 2024-07-31 ENCOUNTER — Inpatient Hospital Stay: Payer: Self-pay | Attending: Hematology and Oncology | Admitting: Hematology and Oncology

## 2024-07-31 VITALS — BP 110/59 | HR 72 | Temp 98.3°F | Resp 18 | Ht 60.0 in | Wt 127.1 lb

## 2024-07-31 DIAGNOSIS — R92343 Mammographic extreme density, bilateral breasts: Secondary | ICD-10-CM

## 2024-07-31 DIAGNOSIS — C50211 Malignant neoplasm of upper-inner quadrant of right female breast: Secondary | ICD-10-CM | POA: Insufficient documentation

## 2024-07-31 DIAGNOSIS — Z1721 Progesterone receptor positive status: Secondary | ICD-10-CM | POA: Insufficient documentation

## 2024-07-31 DIAGNOSIS — Z923 Personal history of irradiation: Secondary | ICD-10-CM | POA: Insufficient documentation

## 2024-07-31 DIAGNOSIS — Z79810 Long term (current) use of selective estrogen receptor modulators (SERMs): Secondary | ICD-10-CM | POA: Insufficient documentation

## 2024-07-31 DIAGNOSIS — Z170 Estrogen receptor positive status [ER+]: Secondary | ICD-10-CM | POA: Insufficient documentation

## 2024-07-31 DIAGNOSIS — Z1732 Human epidermal growth factor receptor 2 negative status: Secondary | ICD-10-CM | POA: Insufficient documentation

## 2024-07-31 NOTE — Progress Notes (Signed)
 Patient Care Team: Pcp, No as PCP - General Izell Domino, MD as Attending Physician (Radiation Oncology) Vernetta Berg, MD as Consulting Physician (General Surgery) Odean Potts, MD as Consulting Physician (Hematology and Oncology) Crawford Morna Pickle, NP as Nurse Practitioner (Hematology and Oncology)  DIAGNOSIS:  Encounter Diagnosis  Name Primary?   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  The report date is March 01, 2022.  MUTYH, NF2 and TSC2 VUS's identified on the CancerNext-Expanded+RNAinsight panel.  These will not change medical management.  The report date is March 09, 2022.  The CancerNext-Expanded gene panel offered by [facility] and includes sequencing and rearrangement analysis for the following 77 genes: AIP, ALK, APC*, ATM*, AXIN2, BAP1, BARD1, BLM, BMPR1A, BRCA1*, BRCA2*, BRIP1*, CDC73, CDH1*, CDK4, CDKN1B, CDKN2A, CHEK2*, CTNNA1, DICER1, FANCC, FH, FLCN, GALNT12, KIF1B, LZTR1, MAX, MEN1, MET, MLH1*, MSH2*, MSH3, MSH6*, MUTYH*, NBN, NF1*, NF2, NTHL1, PALB2*, PHOX2B, PMS2*, POT1, PRKAR1A, PTCH1, PTEN*, RAD51C*, RAD51D*, RB1, RECQL, RET, SDHA, SDHAF2, SDHB, SDHC, SDHD, SMAD4, SMARCA4, SMARCB1, SMARCE1, STK11, SUFU, TMEM127, TP53*,  TSC1, TSC2, VHL and XRCC2 (sequencing and deletion/duplication); EGFR, EGLN1, HOXB13, KIT, MITF, PDGFRA, POLD1, and POLE (sequencing only); EPCAM and GREM1 (deletion/duplication only). DNA and RNA analyses performed for * genes.    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, extends into the dermis of the nipple, margins negative, 1/11 lymph nodes positive ER 90%, PR 90%, HER2 negative, Ki-67 1%   05/02/2022 Cancer Staging   Staging form: Breast, AJCC 8th Edition - Pathologic: Stage IB (pT2, pN1, cM0, G2, ER+, PR+, HER2-) - Signed by Odean Potts, MD on 05/02/2022 Stage prefix: Initial diagnosis Histologic grading system: 3 grade system   05/25/2022 - 07/06/2022 Radiation Therapy   Site Technique Total Dose (Gy) Dose per Fx (Gy) Completed Fx Beam Energies  Breast, Right: Breast_R_IMN 3D 50/50 2 25/25 6XFFF  Breast, Right: Breast_R_PAB_SCV 3D 50/50 2 25/25 6X, 10X  Breast, Right: Breast_R_Bst specialPort 10/10 2 5/5 9E, 12E     07/19/2022 -  Anti-estrogen oral therapy   20 mg Tamoxifen  x 5-10 years     CHIEF COMPLIANT:   HISTORY OF PRESENT ILLNESS: Follow-up on tamoxifen  therapy  History of Present Illness Debra Hobbs is a 48 year old female with breast cancer who presents for follow-up regarding medication side effects.  She experiences dizziness, particularly while working, without a specific trigger. She also has mild hot flashes. She has been taking tamoxifen  but occasionally misses doses.     ALLERGIES:  has no known allergies.  MEDICATIONS:  Current Outpatient Medications  Medication Sig Dispense Refill   acetaminophen  (TYLENOL ) 500 MG tablet Take 500 mg by mouth every 6 (six) hours as needed.     tamoxifen  (NOLVADEX ) 20 MG tablet TAKE 1 TABLET BY  MOUTH EVERY DAY 90 tablet 3   No current facility-administered medications for this visit.    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 Mild hot flashes Slight dizziness   Breast cancer surveillance: Breast exam 07/31/2024: Benign Mammogram 03/05/24: Benign breast density category D Because of high degree of breast density, I recommended that we obtain an MRI every other year. Return to clinic in 1 year for follow-up ------------------------------------- Assessment and Plan Assessment & Plan Estrogen receptor positive malignant neoplasm of upper-inner quadrant of right breast Currently on tamoxifen  with mild hot flashes and dizziness, potentially related to the medication. Missed doses may affect treatment efficacy. Last mammogram on March 18th was normal, but dense breast tissue limits sensitivity. - Advise setting a reminder to take tamoxifen  consistently. - Order breast MRI this year due to dense breast tissue, with plans to repeat every other year.  Dizziness Experiences dizziness, particularly while working, possibly related to tamoxifen . - Assess dizziness symptoms and evaluate relation to tamoxifen .      No orders of the defined types were placed in this encounter.  The patient has a good understanding of the overall plan. she agrees with it. she will call  with any problems that may develop before the next visit here. Total time spent: 30 mins including face to face time and time spent for planning, charting and co-ordination of care   Naomi MARLA Chad, MD 07/31/24

## 2024-10-03 NOTE — Progress Notes (Signed)
 Update: MUTYH p. [id number] VUS reclassified to likely benign. Amended report date is 08/29/2024.

## 2024-11-08 ENCOUNTER — Ambulatory Visit: Payer: Self-pay

## 2024-12-27 ENCOUNTER — Ambulatory Visit: Payer: Self-pay

## 2025-01-17 ENCOUNTER — Ambulatory Visit: Payer: Self-pay | Attending: Radiation Oncology | Admitting: Rehabilitation

## 2025-01-17 DIAGNOSIS — Z170 Estrogen receptor positive status [ER+]: Secondary | ICD-10-CM | POA: Insufficient documentation

## 2025-01-17 DIAGNOSIS — C50211 Malignant neoplasm of upper-inner quadrant of right female breast: Secondary | ICD-10-CM | POA: Insufficient documentation

## 2025-01-17 DIAGNOSIS — Z483 Aftercare following surgery for neoplasm: Secondary | ICD-10-CM | POA: Insufficient documentation

## 2025-01-17 NOTE — Therapy (Signed)
" °  OUTPATIENT PHYSICAL THERAPY SOZO SCREENING NOTE   Patient Name: Debra Hobbs MRN: [id number] DOB:07/13/1976, 49 y.o., female Today's Date: 01/17/2025  PCP: Freddrick, No REFERRING PROVIDER: Izell Domino, MD   PT End of Session - 01/17/25 1046     Visit Number 9   screen   PT Start Time 1000    PT Stop Time 1004    PT Time Calculation (min) 4 min    Activity Tolerance Patient tolerated treatment well    Behavior During Therapy WFL for tasks assessed/performed          Past Medical History:  Diagnosis Date   Breast cancer (HCC)    Cancer (HCC)    Breast   Family history of uterine cancer    Personal history of radiation therapy    Past Surgical History:  Procedure Laterality Date   BREAST BIOPSY Right 01/20/2022   BREAST LUMPECTOMY     BREAST LUMPECTOMY WITH RADIOACTIVE SEED AND SENTINEL LYMPH NODE BIOPSY Right 04/20/2022   Procedure: RIGHT BREAST LUMPECTOMY WITH RADIOACTIVE SEED AND SENTINEL LYMPH NODE BIOPSY;  Surgeon: Vernetta Berg, MD;  Location: [facility];  Service: General;  Laterality: Right;   Patient Active Problem List   Diagnosis Date Noted   Genetic testing 03/03/2022   Family history of uterine cancer 02/23/2022   Malignant neoplasm of upper-inner quadrant of right breast in female, estrogen receptor positive (HCC) 01/26/2022    REFERRING DIAG: right breast cancer at risk for lymphedema  THERAPY DIAG: Aftercare following surgery for neoplasm  Malignant neoplasm of upper-inner quadrant of right breast in female, estrogen receptor positive (HCC)  PERTINENT HISTORY:   Patient was diagnosed on 01/20/22 with right grade 3. It measures 3.3 cm. It is ER/PR positive 90%, HER2- with a Ki67 of 1%. She had lumpectomy with 1/11 nodes removed on 04/20/2022.  Completed radiation   PRECAUTIONS: right UE Lymphedema risk,   SUBJECTIVE: Pt here for last 3 month L-Dex screen.   PAIN:  Are you having pain? No  SOZO SCREENING: Patient was  assessed today using the SOZO machine to determine the lymphedema index score. This was compared to her baseline score. It was determined that she is within the recommended range when compared to her baseline and no further action is needed at this time. She will continue SOZO screenings. These are done every 3 months for 2 years post operatively followed by every 6 months for 2 years, and then annually.   L-DEX FLOWSHEETS - 01/17/25 1000       L-DEX LYMPHEDEMA SCREENING   Measurement Type Unilateral    L-DEX MEASUREMENT EXTREMITY Upper Extremity    POSITION  Standing    DOMINANT SIDE Right    At Risk Side Right    BASELINE SCORE (UNILATERAL) -6.8    L-DEX SCORE (UNILATERAL) -5.5    VALUE CHANGE (UNILAT) 1.3           PLAN:  Continue SOZO every 6 months until May 2027. Pt already has a sleeve that she wears at work.   Larue Saddie SAUNDERS, PT 01/17/2025, 10:47 AM     "

## 2025-07-11 ENCOUNTER — Ambulatory Visit: Payer: Self-pay

## 2025-07-31 ENCOUNTER — Ambulatory Visit: Payer: Self-pay | Admitting: Hematology and Oncology
